# Patient Record
Sex: Female | Born: 1970 | Race: White | Hispanic: No | Marital: Single | Smoking: Current every day smoker
Health system: Southern US, Community
[De-identification: ages and names within clinical notes are randomized; demographics above are authoritative.]

## PROBLEM LIST (undated history)

## (undated) DIAGNOSIS — H409 Unspecified glaucoma: Secondary | ICD-10-CM

## (undated) DIAGNOSIS — F17200 Nicotine dependence, unspecified, uncomplicated: Secondary | ICD-10-CM

## (undated) DIAGNOSIS — F419 Anxiety disorder, unspecified: Secondary | ICD-10-CM

## (undated) DIAGNOSIS — J300 Vasomotor rhinitis: Secondary | ICD-10-CM

## (undated) HISTORY — DX: Vasomotor rhinitis: J30.0

## (undated) HISTORY — DX: Nicotine dependence, unspecified, uncomplicated: F17.200

---

## 1998-03-04 ENCOUNTER — Emergency Department (HOSPITAL_COMMUNITY): Admission: EM | Admit: 1998-03-04 | Discharge: 1998-03-05 | Payer: Self-pay | Admitting: Emergency Medicine

## 2006-03-13 ENCOUNTER — Ambulatory Visit: Payer: Self-pay | Admitting: Family Medicine

## 2006-04-02 ENCOUNTER — Ambulatory Visit: Payer: Self-pay | Admitting: *Deleted

## 2010-01-06 ENCOUNTER — Emergency Department (HOSPITAL_BASED_OUTPATIENT_CLINIC_OR_DEPARTMENT_OTHER): Admission: EM | Admit: 2010-01-06 | Discharge: 2010-01-06 | Payer: Self-pay | Admitting: Emergency Medicine

## 2010-01-06 ENCOUNTER — Ambulatory Visit: Payer: Self-pay | Admitting: Diagnostic Radiology

## 2010-01-10 ENCOUNTER — Ambulatory Visit: Payer: Self-pay | Admitting: Family Medicine

## 2010-12-21 LAB — CBC
MCHC: 34.1 g/dL (ref 30.0–36.0)
RBC: 4.44 MIL/uL (ref 3.87–5.11)
RDW: 12.6 % (ref 11.5–15.5)

## 2010-12-21 LAB — COMPREHENSIVE METABOLIC PANEL
ALT: <6 U/L (ref 0–35)
BUN: 13 mg/dL (ref 6–23)
CO2: 26 mEq/L (ref 19–32)
Calcium: 10.4 mg/dL (ref 8.4–10.5)
Creatinine, Ser: 0.6 mg/dL (ref 0.4–1.2)
Potassium: 4 mEq/L (ref 3.5–5.1)
Sodium: 145 mEq/L (ref 135–145)
Total Bilirubin: 0.4 mg/dL (ref 0.3–1.2)
Total Protein: 7.6 g/dL (ref 6.0–8.3)

## 2010-12-21 LAB — URINE MICROSCOPIC-ADD ON

## 2010-12-21 LAB — DIFFERENTIAL
Basophils Absolute: 0.1 10*3/uL (ref 0.0–0.1)
Basophils Relative: 1 % (ref 0–1)
Eosinophils Relative: 1 % (ref 0–5)
Neutrophils Relative %: 66 % (ref 43–77)

## 2010-12-21 LAB — URINALYSIS, ROUTINE W REFLEX MICROSCOPIC
Nitrite: NEGATIVE
Protein, ur: NEGATIVE mg/dL
pH: 5 (ref 5.0–8.0)

## 2011-02-04 IMAGING — US US ABDOMEN COMPLETE
1 series · 13 of 25 positions shown · non-contrast
Comparison: None

CLINICAL DATA: History of abdominal pain and epigastric pain
radiating into the lower abdomen on both sides.  Pain for 1 week.

ABDOMINAL ULTRASOUND COMPLETE

[Series 1: us abdomen complete · 0.32mm/px · 13 of 57 slices shown]
[im 1/57]
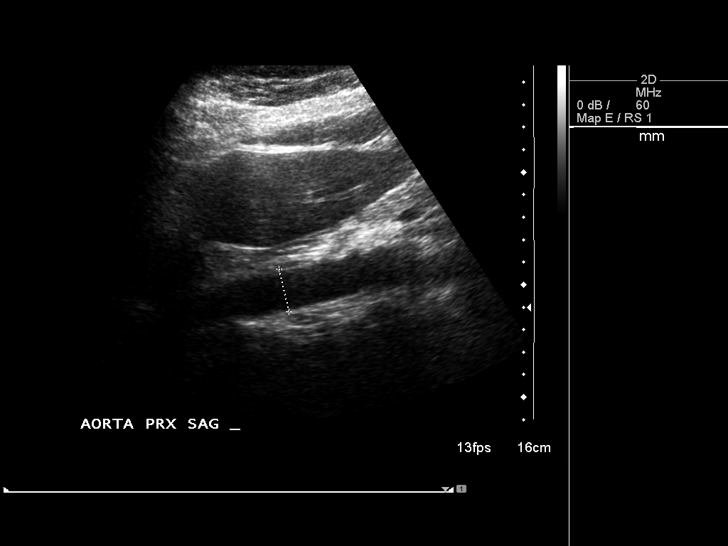
[im 5/57]
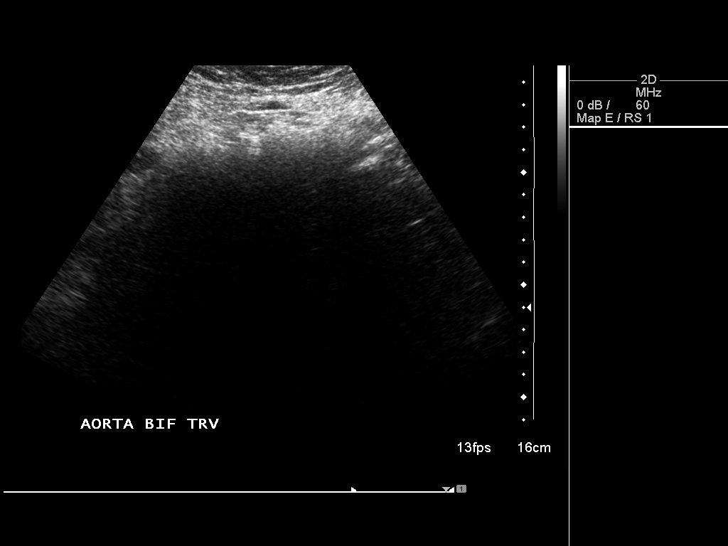
[im 10/57]
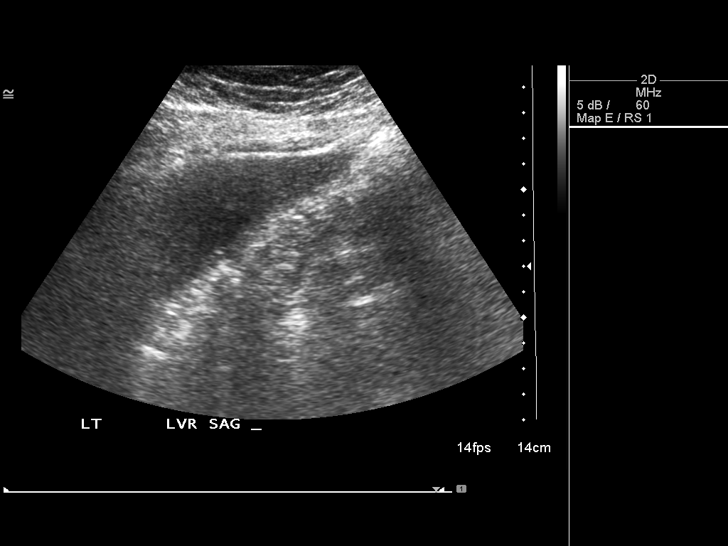
[im 15/57]
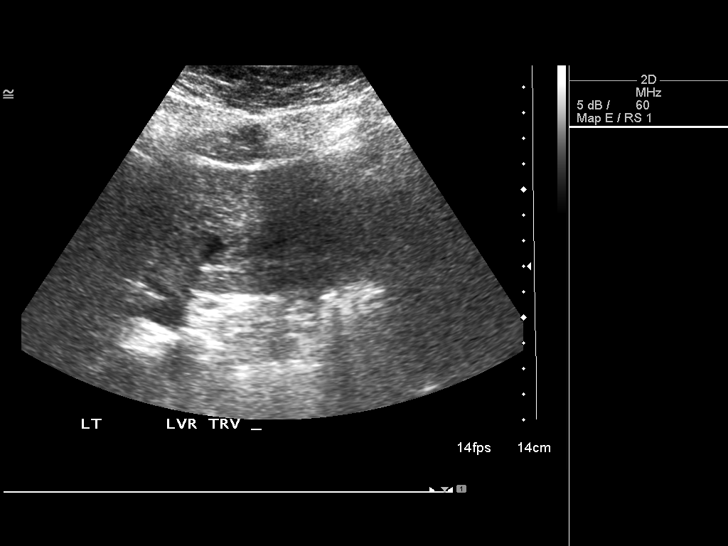
[im 19/57]
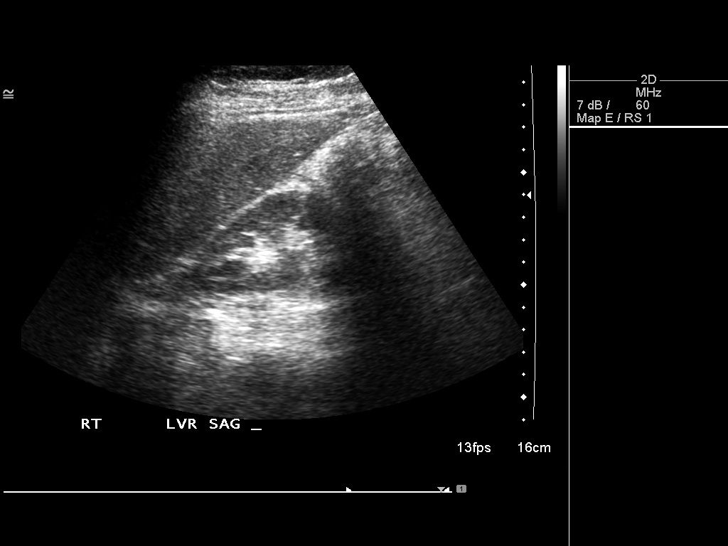
[im 24/57]
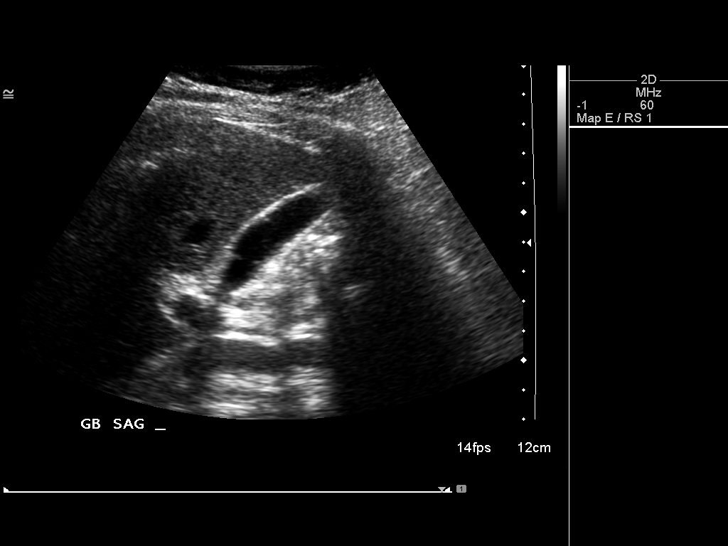
[im 29/57]
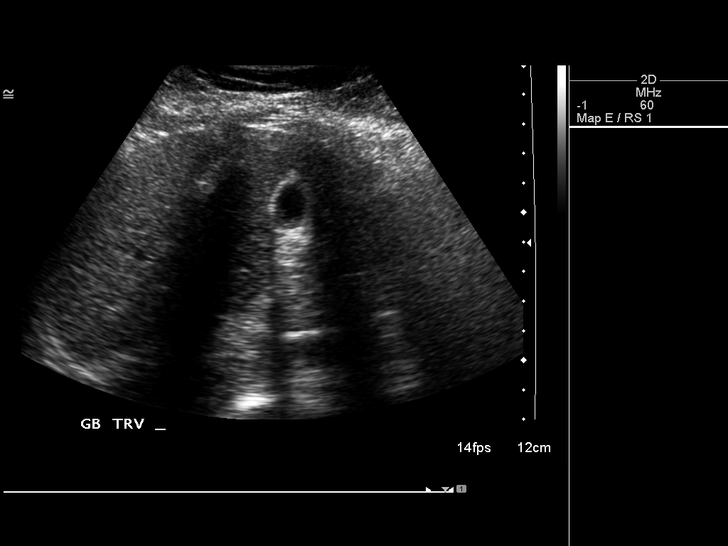
[im 33/57]
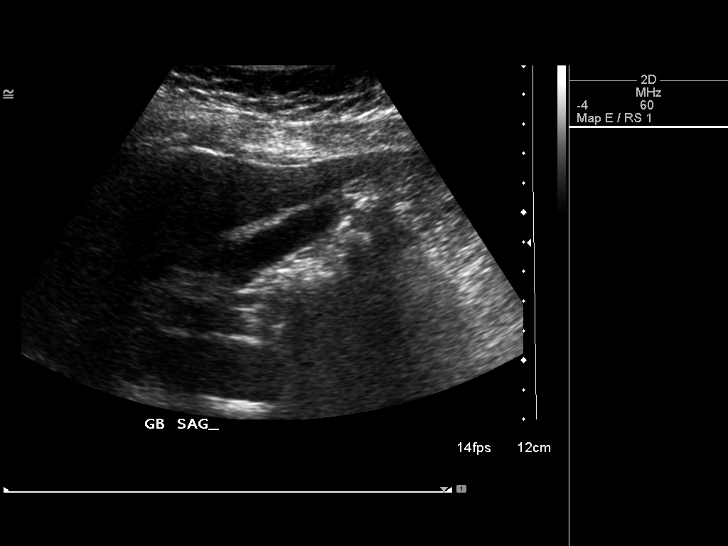
[im 38/57]
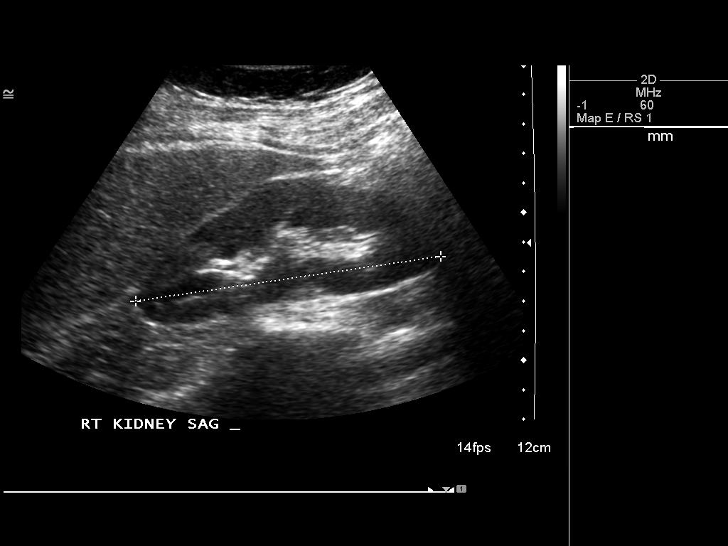
[im 43/57]
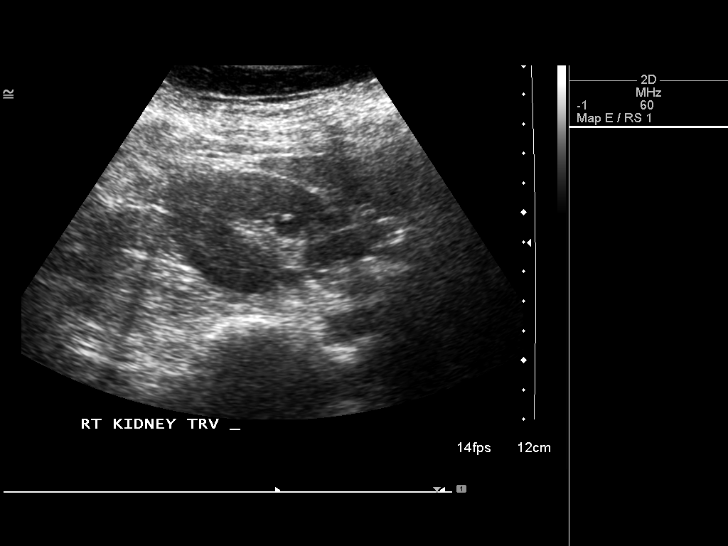
[im 47/57]
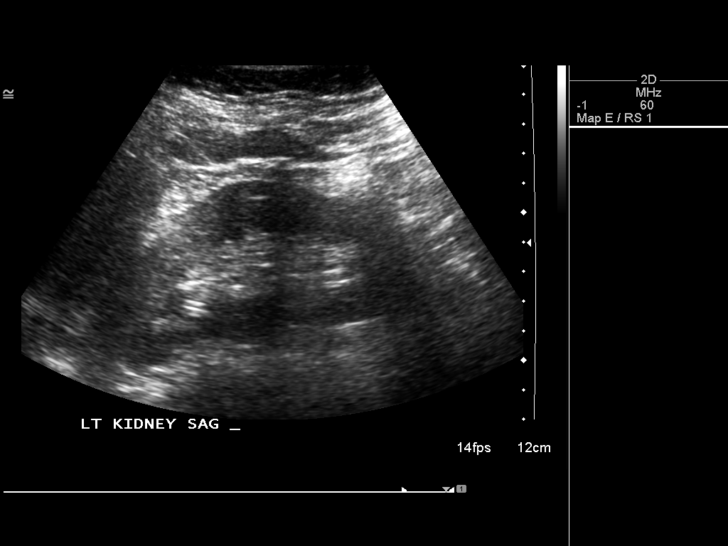
[im 52/57]
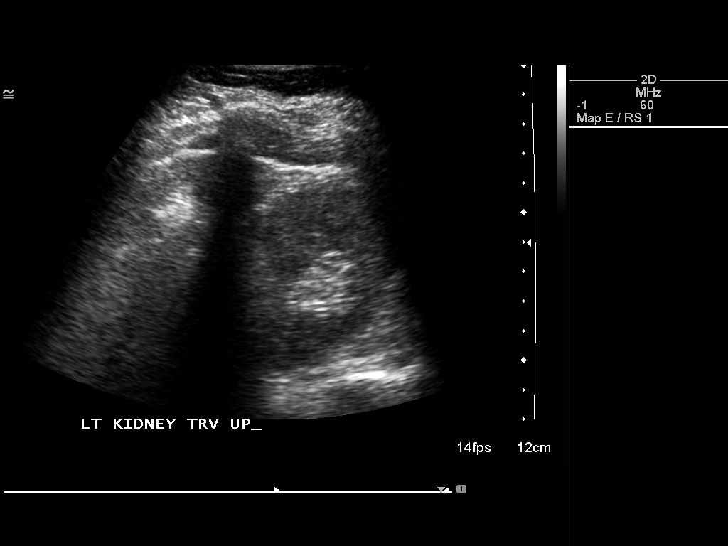
[im 57/57]
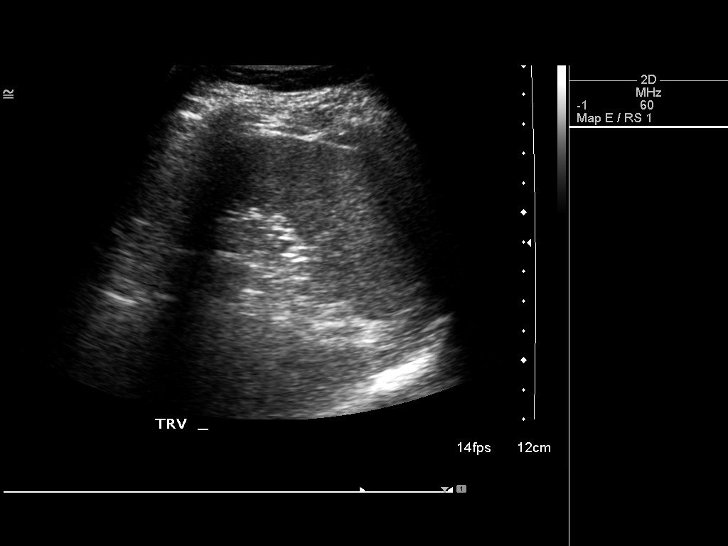

[13 of 25 positions shown; findings below may reference images not displayed]

FINDINGS: Gallbladder: No shadowing gallstones or echogenic sludge. No
gallbladder wall thickening or pericholecystic fluid. The
gallbladder wall thickness measured 2.4 mm. No sonographic Murphy's
sign according to the ultrasound technologist. The patient was not
n.p.o. and the gallbladder was not well distended.  There appear to
be small comet-tail artifacts or small polyps associated with the
gallbladder wall.  However the gallbladder was not well distended
and I cannot exclude that these densities could be associated with
a partially contracted gallbladder.

CBD: Normal in caliber measuring 2 mm. No choledocholithiasis is
evident.

Liver:  Normal size and echotexture without focal parenchymal
abnormality.

IVC:  Patent throughout its visualized course in the abdomen.

Pancreas:  Although the pancreas is difficult to visualize in its
entirety, no focal pancreatic abnormality is identified.

Spleen:  Normal size and echotexture without focal abnormality.
Length is 9.3 cm.

Right kidney:  No hydronephrosis.  Well-preserved cortex.  Normal
parenchymal echotexture without focal abnormalities.  Right renal
length is 10.4 cm.

Left kidney:  No hydronephrosis.  Well-preserved cortex.  Normal
parenchymal echotexture without focal abnormalities.  Left renal
length is 10.8 cm.

Aorta:  Maximum diameter is 2 cm.  No aneurysm is evident.

Ascites:  None.
IMPRESSION: No acute abdominal pathology was demonstrated.  The gallbladder is
not well distended since the patient had not been fasting.  No
calculi were seen.  Densities are visualized contiguous with
mucosal surface suggesting small polyps and / or comet-tail
artifacts which may be associated with adenomyomatosis of the
gallbladder.  However the gallbladder is not completely distended.
If further evaluation of the gallbladder is felt clinically
indicated, repeat gallbladder ultrasound after the usual period of
fasting could be performed to see whether the possible polyps or
comet-tail artifacts persist when the gallbladder is well
distended.  Alternatively nuclear medicine hepatobiliary imaging
could be considered.

No other abdominal pathology is evident.

## 2011-09-21 ENCOUNTER — Encounter: Payer: Self-pay | Admitting: Internal Medicine

## 2011-09-21 ENCOUNTER — Ambulatory Visit (INDEPENDENT_AMBULATORY_CARE_PROVIDER_SITE_OTHER): Payer: PRIVATE HEALTH INSURANCE | Admitting: Family Medicine

## 2011-09-21 VITALS — BP 142/80 | Temp 99.1°F | Wt 151.0 lb

## 2011-09-21 DIAGNOSIS — F17200 Nicotine dependence, unspecified, uncomplicated: Secondary | ICD-10-CM | POA: Insufficient documentation

## 2011-09-21 DIAGNOSIS — J45909 Unspecified asthma, uncomplicated: Secondary | ICD-10-CM

## 2011-09-21 DIAGNOSIS — R509 Fever, unspecified: Secondary | ICD-10-CM

## 2011-09-21 DIAGNOSIS — J45901 Unspecified asthma with (acute) exacerbation: Secondary | ICD-10-CM

## 2011-09-21 LAB — POCT INFLUENZA A/B: Influenza A, POC: NEGATIVE

## 2011-09-21 MED ORDER — ALBUTEROL SULFATE HFA 108 (90 BASE) MCG/ACT IN AERS: 2.0000 | INHALATION_SPRAY | Freq: Four times a day (QID) | RESPIRATORY_TRACT | Status: DC | PRN

## 2011-09-21 MED ORDER — AMOXICILLIN 875 MG PO TABS: 875.0000 mg | ORAL_TABLET | Freq: Two times a day (BID) | ORAL | Status: AC

## 2011-09-21 NOTE — Progress Notes (Signed)
  Subjective:    Patient ID: Madeline Cervantes, female    DOB: 08-06-1971, 40 y.o.   MRN: [id number]  HPI Madeline Cervantes 2 weeks ago she had difficulty with ringing in ears , chest and head congestion. Started having difficulty with cough about a week ago it is nonproductive. He has had intermittent fever and chills. She has been trying over-the-counter medications without intermittent success. She does smoke but has cut back due to the cough and congestion   Review of Systems     Objective:   Physical Exam alert and in no distress. Tympanic membranes and canals are normal. Throat is clear. Tonsils are normal. Neck is supple without adenopathy or thyromegaly. Cardiac exam shows a regular sinus rhythm without murmurs or gallops. Lungs show scattered rhonchi and wheezing       Assessment & Plan:   1. Fever  POCT Influenza A/B  2. Acute asthmatic bronchitis    3. Current smoker     I will place her on Amoxil. We'll also give Proventil to help with her breathing. Encouraged her to quit smoking and call me if she needs help. She is to call if not entirely better

## 2011-09-21 NOTE — Patient Instructions (Signed)
Take all the antibiotic and call me at the end of the course if you're not 100% better. Use the inhaler as needed for congestion and cough. When you're ready to quit smoking and wants help call me

## 2012-09-03 ENCOUNTER — Encounter: Payer: Self-pay | Admitting: Medical

## 2012-09-03 ENCOUNTER — Ambulatory Visit (INDEPENDENT_AMBULATORY_CARE_PROVIDER_SITE_OTHER): Payer: PRIVATE HEALTH INSURANCE | Admitting: Medical

## 2012-09-03 VITALS — BP 110/82 | Temp 98.2°F | Wt 142.0 lb

## 2012-09-03 DIAGNOSIS — B86 Scabies: Secondary | ICD-10-CM

## 2012-09-03 MED ORDER — HYDROXYZINE HCL 25 MG PO TABS: 25.0000 mg | ORAL_TABLET | Freq: Four times a day (QID) | ORAL | Status: DC | PRN

## 2012-09-03 MED ORDER — PERMETHRIN 5 % EX CREA: TOPICAL_CREAM | CUTANEOUS | Status: DC

## 2012-09-03 NOTE — Progress Notes (Signed)
Subjective:  Here for possible scabies.  Here with both sons today for same c/o.  Son's girlfriend and household contacts have had itching and skin sores over a month ago.   Son was living there initially, but now he and girlfriend and their baby have moved in with mother and every body in mother's house now has itching and sores popping up on their bodies x 43mo. The baby was seen by their pediatrician, initially thought to have eczema, but after things worsened, but just recently treated for scabies and is better. However, everybody in the house now seems to have itching and sores. Otherwise in normal state of health.   Objective:  Gen: wd, wn, nad  Skin: several scattered lesions including small round and some pink raised lesions, some linear burrow type lesions on hands and feet, in between fingers and toes, numerous excoriations on most of back and arms, small scattered pink/red round lesions on back, all suggestive of scabies   Assessment:  Encounter Diagnosis   Name  Primary?   .  Scabies  Yes    Plan:  Discussed diagnosis, means of transmission, discussed thorough home cleaning including washing bed sheets in hot soapy water in washing machine and high heat dryer cycle, fabric things like toys that can't be washed, place in sealed bag for 4 days to suffocate the mites. Can use hydroxyzine for itching. Script for permethrin. May repeat daily for a week, then 2x/wk until resolved. Recheck if not resolved in 2wk.

## 2012-09-03 NOTE — Patient Instructions (Signed)
Scabies Scabies are small bugs (mites) that burrow under the skin and cause red bumps and severe itching. These bugs can only be seen with a microscope. Scabies are highly contagious. They can spread easily from person to person by direct contact. They are also spread through sharing clothing or linens that have the scabies mites living in them. It is not unusual for an entire family to become infected through shared towels, clothing, or bedding.  HOME CARE INSTRUCTIONS   Your caregiver may prescribe a cream or lotion to kill the mites. If cream is prescribed, massage the cream into the entire body from the neck to the bottom of both feet. Also massage the cream into the scalp and face if your child is less than 1 year old. Avoid the eyes and mouth. Do not wash your hands after application.  Leave the cream on for 8 to 12 hours. Your child should bathe or shower after the 8 to 12 hour application period. Sometimes it is helpful to apply the cream to your child right before bedtime.  One treatment is usually effective and will eliminate approximately 95% of infestations. For severe cases, your caregiver may decide to repeat the treatment in 1 week. Everyone in your household should be treated with one application of the cream.  New rashes or burrows should not appear within 24 to 48 hours after successful treatment. However, the itching and rash may last for 2 to 4 weeks after successful treatment. Your caregiver may prescribe a medicine to help with the itching or to help the rash go away more quickly.  Scabies can live on clothing or linens for up to 3 days. All of your child's recently used clothing, towels, stuffed toys, and bed linens should be washed in hot water and then dried in a dryer for at least 20 minutes on high heat. Items that cannot be washed should be enclosed in a plastic bag for at least 3 days.  To help relieve itching, bathe your child in a cool bath or apply cool washcloths to the  affected areas.  Your child may return to school after treatment with the prescribed cream. SEEK MEDICAL CARE IF:   The itching persists longer than 4 weeks after treatment.  The rash spreads or becomes infected. Signs of infection include red blisters or yellow-tan crust. Document Released: 09/18/2005 Document Revised: 12/11/2011 Document Reviewed: 01/27/2009 [employer] Patient Information 2013 [employer].  

## 2012-11-29 ENCOUNTER — Other Ambulatory Visit: Payer: Self-pay | Admitting: Medical

## 2012-11-29 NOTE — Telephone Encounter (Signed)
PATIENT NEEDS A FOLLOW UP VISIT.

## 2012-12-01 ENCOUNTER — Other Ambulatory Visit: Payer: Self-pay | Admitting: Medical

## 2016-04-05 ENCOUNTER — Encounter (HOSPITAL_COMMUNITY): Payer: Self-pay

## 2016-04-05 ENCOUNTER — Emergency Department (HOSPITAL_COMMUNITY)
Admission: EM | Admit: 2016-04-05 | Discharge: 2016-04-05 | Disposition: A | Discharge status: Home / Self Care | Payer: Managed Care, Other (non HMO) | Attending: Emergency Medicine | Admitting: Emergency Medicine

## 2016-04-05 DIAGNOSIS — F17210 Nicotine dependence, cigarettes, uncomplicated: Secondary | ICD-10-CM | POA: Diagnosis not present

## 2016-04-05 DIAGNOSIS — G8929 Other chronic pain: Secondary | ICD-10-CM | POA: Insufficient documentation

## 2016-04-05 DIAGNOSIS — M545 Low back pain, unspecified: Secondary | ICD-10-CM

## 2016-04-05 HISTORY — DX: Anxiety disorder, unspecified: F41.9

## 2016-04-05 HISTORY — DX: Unspecified glaucoma: H40.9

## 2016-04-05 MED ORDER — CYCLOBENZAPRINE HCL 10 MG PO TABS
10.0000 mg | ORAL_TABLET | Freq: Once | ORAL | Status: AC
Start: 2016-04-05 — End: 2016-04-05
  Administered 2016-04-05: 10 mg via ORAL
  Filled 2016-04-05: qty 1

## 2016-04-05 MED ORDER — HYDROCODONE-ACETAMINOPHEN 5-325 MG PO TABS: 1.0000 | ORAL_TABLET | Freq: Four times a day (QID) | ORAL | Status: DC | PRN

## 2016-04-05 MED ORDER — CYCLOBENZAPRINE HCL 10 MG PO TABS: 10.0000 mg | ORAL_TABLET | Freq: Two times a day (BID) | ORAL | Status: DC | PRN

## 2016-04-05 MED ORDER — HYDROCODONE-ACETAMINOPHEN 5-325 MG PO TABS
1.0000 | ORAL_TABLET | Freq: Once | ORAL | Status: AC
  Administered 2016-04-05: 1 via ORAL
  Filled 2016-04-05: qty 1

## 2016-04-05 NOTE — ED Notes (Signed)
Patient complains of chronic lower back pain for months and has known disc disease. Has had increased pain the past several days, denies any new trauma

## 2016-04-05 NOTE — ED Provider Notes (Signed)
CSN: [id number]     Arrival date & time 04/05/16  1530 History  By signing my name below, I, Madeline Cervantes, attest that this documentation has been prepared under the direction and in the presence of [facility] Avielle Imbert, NP-C. Electronically Signed: Phillis HaggisGabriella Cervantes, ED Scribe. 04/05/2016. 4:24 PM.   Chief Complaint  Patient presents with  . Back Pain   Patient is a 45 y.o. female presenting with back pain. The history is provided by the patient. No language interpreter was used.  Back Pain Location:  Lumbar spine Quality:  Aching Radiates to:  L foot, R foot, L thigh and R thigh Pain severity:  Mild Onset quality:  Gradual Timing:  Constant Progression:  Worsening Chronicity:  Chronic Context: not falling and not recent injury   Ineffective treatments:  None tried Associated symptoms: no bladder incontinence, no bowel incontinence, no chest pain, no numbness and no weakness   HPI Comments: Madeline CaffeyKimberly C Cervantes is a 45 y.o. female who presents to the Emergency Department complaining of worsening of her chronic lower back pain that radiate down both legs onset several days ago. She reports associated "knots" to the lower back. She reports worsening pain with ambulation and bending at the waist. Pt has had a previous MRI work-up where it revealed a disc shift despite no trauma to the area. She has been having cortisone shots to the area but has not had any recently. Pt is prescribed Tramadol by her doctor but has not had any in a few weeks. She has been taking OTC pain medication to no relief. Pt is due to follow up with the chiropractor next week. Pt denies new injury, fall, chest pain, SOB, gait problem, rash, wound, numbness, or weakness.   Past Medical History  Diagnosis Date  . Allergic rhinitis   . Vasomotor rhinitis   . Smoker   . Glaucoma   . Anxiety    History reviewed. No pertinent past surgical history. No family history on file. Social History  Substance Use Topics  . Smoking status:  Current Every Day Smoker    Types: Cigarettes  . Smokeless tobacco: None  . Alcohol Use: None   OB History    No data available     Review of Systems  Respiratory: Negative for shortness of breath.   Cardiovascular: Negative for chest pain.  Gastrointestinal: Negative for bowel incontinence.  Genitourinary: Negative for bladder incontinence.  Musculoskeletal: Positive for back pain. Negative for gait problem.  Skin: Negative for rash and wound.  Neurological: Negative for weakness and numbness.   Allergies  Review of patient's allergies indicates no known allergies.  Home Medications   Prior to Admission medications   Medication Sig Start Date End Date Taking? Authorizing Provider  albuterol (PROVENTIL HFA;VENTOLIN HFA) 108 (90 BASE) MCG/ACT inhaler Inhale 2 puffs into the lungs every 6 (six) hours as needed for wheezing. 09/21/11 09/20/12  Ronnald NianJohn C Lalonde, MD  ALPRAZolam Prudy Feeler(XANAX) 0.5 MG tablet Take 0.5 mg by mouth at bedtime as needed.      Historical Provider, MD  cyclobenzaprine (FLEXERIL) 10 MG tablet Take 1 tablet (10 mg total) by mouth 2 (two) times daily as needed for muscle spasms. 04/05/16   Garrick Midgley Orlene OchM Betsaida [place], NP  HYDROcodone-acetaminophen (NORCO) 5-325 MG tablet Take 1 tablet by mouth every 6 (six) hours as needed. 04/05/16   Jasaiah Karwowski Orlene OchM Chad Donoghue, NP  hydrOXYzine (ATARAX/VISTARIL) 25 MG tablet TAKE 1 TABLET (25 MG TOTAL) BY MOUTH EVERY 6 (SIX) HOURS AS NEEDED FOR ITCHING. 11/29/12  Kermit Baloavid S Tysinger, PA-C  hydrOXYzine (ATARAX/VISTARIL) 25 MG tablet TAKE 1 TABLET (25 MG TOTAL) BY MOUTH EVERY 6 (SIX) HOURS AS NEEDED FOR ITCHING. 12/01/12   Kermit Baloavid S Tysinger, PA-C  permethrin (ELIMITE) 5 % cream Apply at bedtime, massage all over body, wash off in the morning.  May repeat daily for up to a week, then 2x /wk until this resolves. 09/03/12   Kermit Baloavid S Tysinger, PA-C   BP 165/98 mmHg  Pulse 89  Temp(Src) 98.1 F (36.7 C) (Oral)  Resp 18  SpO2 96% Physical Exam  Constitutional: She is oriented to  person, place, and time. She appears well-developed and well-nourished.  HENT:  Head: Normocephalic and atraumatic.  Mouth/Throat: Oropharynx is clear and moist.  Eyes: Conjunctivae and EOM are normal. Pupils are equal, round, and reactive to light.  Neck: Normal range of motion. Neck supple.  Cardiovascular: Normal rate and regular rhythm.   Pulses:      Radial pulses are 2+ on the right side, and 2+ on the left side.  Pulmonary/Chest: Effort normal.  Abdominal: Soft. Bowel sounds are normal. There is no tenderness. There is no CVA tenderness.  Musculoskeletal: Normal range of motion.       Lumbar back: She exhibits spasm.  Tenderness with palpation over left and right sciatic nerve  Neurological: She is alert and oriented to person, place, and time.  Grips are equal; distals pulses are 2+; reflexes are normal; steady gait with no foot drag; able to perform bilateral SLRs but complained of pain  Skin: Skin is warm and dry.  Psychiatric: She has a normal mood and affect. Her behavior is normal.  Nursing note and vitals reviewed.   ED Course  Procedures (including critical care time) DIAGNOSTIC STUDIES: Oxygen Saturation is 97% on RA, normal by my interpretation.    COORDINATION OF CARE: 4:24 PM-Discussed treatment plan which includes pain management and work note with pt at bedside and pt agreed to plan.   5:06 PM- pt improved greatly after medication. Safe for discharge.   MDM  [age] y.o. female with hx of chronic low back pain and known disc disease stable for d/c without focal neuro deficits. Will treat for pain and muscle spasm and she will keep her follow up appointment with her doctor. Discussed with the patient clinical findings and plan of care and all questioned fully answered. She will return if any problems arise.  Final diagnoses:  Acute exacerbation of chronic low back pain    I personally performed the services described in this documentation, which was scribed in my  presence. The recorded information has been reviewed and is accurate.      [address]Ayelen Sciortino [place] Latrisha Coiro, NP 04/06/16 0159  Lyndal Pulleyaniel Knott, MD 04/06/16 [phone]

## 2016-04-05 NOTE — Discharge Instructions (Signed)
Your may continue to take ibuprofen with the medications we give you. Do not drive while taking the narcotic or muscle relaxant because they will make you sleepy. Follow up with your doctor as scheduled.

## 2017-02-28 ENCOUNTER — Encounter (HOSPITAL_BASED_OUTPATIENT_CLINIC_OR_DEPARTMENT_OTHER): Payer: Self-pay | Admitting: *Deleted

## 2017-02-28 ENCOUNTER — Emergency Department (HOSPITAL_BASED_OUTPATIENT_CLINIC_OR_DEPARTMENT_OTHER)
Admission: EM | Admit: 2017-02-28 | Discharge: 2017-02-28 | Disposition: A | Discharge status: Home / Self Care | Payer: Managed Care, Other (non HMO) | Attending: Emergency Medicine | Admitting: Emergency Medicine

## 2017-02-28 DIAGNOSIS — M545 Low back pain, unspecified: Secondary | ICD-10-CM

## 2017-02-28 DIAGNOSIS — G8929 Other chronic pain: Secondary | ICD-10-CM

## 2017-02-28 DIAGNOSIS — F17210 Nicotine dependence, cigarettes, uncomplicated: Secondary | ICD-10-CM | POA: Insufficient documentation

## 2017-02-28 MED ORDER — CYCLOBENZAPRINE HCL 10 MG PO TABS: 10.0000 mg | ORAL_TABLET | Freq: Two times a day (BID) | ORAL | 0 refills | Status: DC | PRN

## 2017-02-28 MED ORDER — CYCLOBENZAPRINE HCL 10 MG PO TABS
10.0000 mg | ORAL_TABLET | Freq: Once | ORAL | Status: AC
  Administered 2017-02-28: 10 mg via ORAL
  Filled 2017-02-28: qty 1

## 2017-02-28 NOTE — ED Notes (Signed)
Patient presents with c/o lower back pain that has been ongoing.  Is being seen by an MD and received a cortisone shot last Tuesday but states this one did not help.  States the pain goes down the back of her left leg into her feet and it feels like her toes are curling up. Remains sitting on the side of the bed.

## 2017-02-28 NOTE — ED Triage Notes (Signed)
Pt c/o back pain after "steriod injection" x 1 week ago

## 2017-02-28 NOTE — ED Notes (Signed)
Patients BP elevated - stated that it is because she is frustrated and is hurting

## 2017-02-28 NOTE — Discharge Instructions (Signed)
Please follow up with ortho regarding today's Emergency Department visit. You can take Flexeril by mouth 2 times daily for muscle spasms. Please use caution when working or driving because this medication can make you more sleepy. Please also follow up with your primary care provider within the next week to have your blood pressure re-checked because it was elevated during today's visit. If you develop new symptoms including numbness, weakness, tingling, or incontinence, please return to the Emergency Department for re-evaluation. Please continue to apply ice to the area, and take Tylenol and ibuprofen as directed on the bottle.

## 2017-02-28 NOTE — ED Notes (Signed)
Discharge instructions and prescription reviewed - voiced understanding.  

## 2017-02-28 NOTE — ED Provider Notes (Signed)
[facility] Provider Note   CSN: [id number] Arrival date & time: 02/28/17  1914  By signing my name below, I, Modena JanskyAlbert Thayil, attest that this documentation has been prepared under the direction and in the presence of non-physician practitioner, Frederik PearMia Nilson Tabora, PA-C. Electronically Signed: Modena JanskyAlbert Thayil, Scribe. 02/28/2017. 9:23 PM.  History   Chief Complaint Chief Complaint  Patient presents with  . Back Pain   The history is provided by the patient. No language interpreter was used.   HPI Comments: Madeline Cervantes is a [age] y.o. female who presents to the Emergency Department complaining of constant moderate lower back pain that started about a week ago. She reports her lumbar disc slipped last year and she has been having intermittent pain since then. Her pain became constant after receiving a cortisone injection on 02/20/17 by Dr. Regino SchultzeWang in ortho. She iced her back PTA with minimal relief. Her pain is relieved by bending over and radiates to her LLE with associated left foot tingling. Denies any hx of cancer, fever, abdominal pain, constipation, nausea, vomiting, bladder/bowel incontinence, gait problem, dysuria, or other complaints at this time.  PCP: Ronnald NianLalonde, John C, MD  Past Medical History:  Diagnosis Date  . Allergic rhinitis   . Anxiety   . Glaucoma   . Smoker   . Vasomotor rhinitis     Patient Active Problem List   Diagnosis Date Noted  . Current smoker 09/21/2011    History reviewed. No pertinent surgical history.  OB History    No data available       Home Medications    Prior to Admission medications   Medication Sig Start Date End Date Taking? Authorizing Provider  ALPRAZolam Prudy Feeler(XANAX) 0.5 MG tablet Take 0.5 mg by mouth at bedtime as needed.      [provider]  cyclobenzaprine (FLEXERIL) 10 MG tablet Take 1 tablet (10 mg total) by mouth 2 (two) times daily as needed for muscle spasms. 02/28/17   Lyman Balingit A, PA-C  permethrin (ELIMITE)  5 % cream Apply at bedtime, massage all over body, wash off in the morning.  May repeat daily for up to a week, then 2x /wk until this resolves. 09/03/12   Tysinger, Kermit Baloavid S, PA-C    Family History No family history on file.  Social History Social History  Substance Use Topics  . Smoking status: Current Every Day Smoker    Packs/day: 1.00    Types: Cigarettes  . Smokeless tobacco: Not on file  . Alcohol use No     Allergies   Patient has no known allergies.   Review of Systems Review of Systems  Constitutional: Negative for activity change and fever.  Respiratory: Negative for shortness of breath.   Cardiovascular: Negative for chest pain.  Gastrointestinal: Negative for abdominal pain, constipation, nausea and vomiting.  Genitourinary: Negative for dysuria.  Musculoskeletal: Positive for back pain (Lower). Negative for gait problem.  Skin: Negative for rash.   Physical Exam Updated Vital Signs BP (!) 174/100   Pulse 90   Temp 98.2 F (36.8 C) (Oral)   Resp 18   Ht 5\' 9"  (1.753 m)   Wt 135 lb (61.2 kg)   SpO2 99%   BMI 19.94 kg/m   Physical Exam  Constitutional: No distress.  HENT:  Head: Normocephalic.  Eyes: Conjunctivae are normal.  Neck: Neck supple.  Cardiovascular: Normal rate and regular rhythm.  Exam reveals no gallop and no friction rub.   No murmur heard. Pulmonary/Chest: Effort  normal. No respiratory distress.  Abdominal: Soft. She exhibits no distension.  Musculoskeletal:  Gait is normal. 5/5 strength of the BLE. +2 DP and PT pulses. Full ROM of the bilateral ankle, knees, and hips. Able to ambulate without difficulty and bear weight on BLE. No sensory deficits.  Lateral to the lumbar spine there is a small dime-sized ciruclar area of ecchymosis. No warmth, swelling, or redness. No midline tenderness of the C, T, or L spine. No step offs. Normal ROM.   Neurological: She is alert.  Skin: Skin is warm. No rash noted.  Psychiatric: Her behavior is  normal.  Nursing note and vitals reviewed.    ED Treatments / Results  DIAGNOSTIC STUDIES: Oxygen Saturation is 99% on RA, normal by my interpretation.    COORDINATION OF CARE: 9:27 PM- Pt advised of plan for treatment and pt agrees.  Labs (all labs ordered are listed, but only abnormal results are displayed) Labs Reviewed - No data to display  EKG  EKG Interpretation None       Radiology No results found.  Procedures Procedures (including critical care time)  Medications Ordered in ED Medications  cyclobenzaprine (FLEXERIL) tablet 10 mg (10 mg Oral Given 02/28/17 2158)     Initial Impression / Assessment and Plan / ED Course  I have reviewed the triage vital signs and the nursing notes.  Pertinent labs & imaging results that were available during my care of the patient were reviewed by me and considered in my medical decision making (see chart for details).     Patient with chronic back pain, mechanical in nature. VSS. NAD. No neurological deficits and normal neuro exam.  Patient can walk but states it is painful.  No urinary or bowel incontinence.  No concern for cauda equina, infection, AAA, infection, or fracture.  No constitutional symptoms including fever, chills, or weight loss,  No h/o cancer, IVDU.  Will discharge to home with follow up to ortho, RICE protocol, flexeril, and anti-inflammatory medicine. Discussed ED return precautions. The patient acknowledges the plan at this time.   Final Clinical Impressions(s) / ED Diagnoses   Final diagnoses:  Acute exacerbation of chronic low back pain    New Prescriptions Discharge Medication List as of 02/28/2017 10:35 PM     I personally performed the services described in this documentation, which was scribed in my presence. The recorded information has been reviewed and is accurate.     Frederik Pear A, PA-C 03/02/17 [id number]    Gwyneth Sprout, MD 03/02/17 [phone]

## 2018-12-20 ENCOUNTER — Ambulatory Visit: Payer: 59 | Admitting: Medical

## 2018-12-20 ENCOUNTER — Other Ambulatory Visit: Payer: Self-pay

## 2018-12-20 ENCOUNTER — Encounter: Payer: Self-pay | Admitting: Medical

## 2018-12-20 VITALS — BP 130/90 | HR 102 | Temp 98.1°F | Resp 16 | Ht 68.0 in | Wt 130.8 lb

## 2018-12-20 DIAGNOSIS — R6883 Chills (without fever): Secondary | ICD-10-CM | POA: Diagnosis not present

## 2018-12-20 DIAGNOSIS — R0602 Shortness of breath: Secondary | ICD-10-CM

## 2018-12-20 DIAGNOSIS — R05 Cough: Secondary | ICD-10-CM | POA: Diagnosis not present

## 2018-12-20 DIAGNOSIS — R059 Cough, unspecified: Secondary | ICD-10-CM

## 2018-12-20 MED ORDER — HYDROCODONE-HOMATROPINE 5-1.5 MG/5ML PO SYRP: 5.0000 mL | ORAL_SOLUTION | Freq: Three times a day (TID) | ORAL | 0 refills | Status: AC | PRN

## 2018-12-20 MED ORDER — ALBUTEROL SULFATE HFA 108 (90 BASE) MCG/ACT IN AERS: 2.0000 | INHALATION_SPRAY | Freq: Four times a day (QID) | RESPIRATORY_TRACT | 0 refills | Status: DC | PRN

## 2018-12-20 NOTE — Progress Notes (Signed)
Subjective:     Patient ID: Madeline Cervantes, female   DOB: 04-27-1971, 48 y.o.   MRN: [id number]  HPI Chief Complaint  Patient presents with  . Sore throat    sore throat, sneezing, cough, headache, body achey X wednesday   Here for feeling "like shit."  She note 2 days of coughing a lot, headache from cough, sneezing, headaches, boyd aches, stiff.  Has had some chills.  No fever.   She notes scratchy throat, coughing up some mucous.  Has some sinus pressure.   No recent travel.  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 Has had sick contacts at work.  Felt warm but no specific fever.  Has used some alka seltzer.  Chest is tight.  Denies SOB.   Is a smoker.   No other aggravating or relieving factors. No other complaint.  Past Medical History:  Diagnosis Date  . Allergic rhinitis   . Anxiety   . Glaucoma   . Smoker   . Vasomotor rhinitis    Current Outpatient Medications on File Prior to Visit  Medication Sig Dispense Refill  . ALPRAZolam (XANAX) 0.5 MG tablet Take 0.5 mg by mouth at bedtime as needed.       No current facility-administered medications on file prior to visit.     Review of Systems As in subjective    Objective:   Physical Exam BP 130/90   Pulse (!) 102   Temp 98.1 F (36.7 C) (Oral)   Resp 16   Ht 5\' 8"  (1.727 m)   Wt 130 lb 12.8 oz (59.3 kg)   SpO2 95%   BMI 19.89 kg/m   Wt Readings from Last 3 Encounters:  12/20/18 130 lb 12.8 oz (59.3 kg)  02/28/17 135 lb (61.2 kg)  09/03/12 142 lb (64.4 kg)   General appearance: Alert, WD/WN, no distress,somewhat ill appearing                             Skin: warm, no rash, no diaphoresis                           Head: mild sinus tenderness                            Eyes: conjunctiva normal, corneas clear, PERRLA                            Ears: pearly TMs, external ear canals normal                          Nose: septum midline, turbinates swollen, with erythema and clear discharge  Mouth/throat: MMM, tongue normal, mild pharyngeal erythema                           Neck: supple, no adenopathy, no thyromegaly, non tender                          Heart: RRR, normal S1, S2, no murmurs                         Lungs: +bronchial breath sounds, no rhonchi, no wheezes, no rales  Extremities: no edema, non tender         Assessment:     Encounter Diagnoses  Name Primary?  . Cough Yes  . Chills        Plan:     Flu negative  Symptoms and exam suggest respiratory tract infection.  Begin medication below, rest, hydrate well, discussed preventative measures, self quarantine the next several days until afebrile much improved.  If worse or not improving over the weekend call or go to emergency department  Madeline Cervantes was seen today for sore throat.  Diagnoses and all orders for this visit:  Cough  Chills  Other orders -     HYDROcodone-homatropine (HYCODAN) 5-1.5 MG/5ML syrup; Take 5 mLs by mouth every 8 (eight) hours as needed for up to 5 days. -     albuterol (PROVENTIL HFA;VENTOLIN HFA) 108 (90 Base) MCG/ACT inhaler; Inhale 2 puffs into the lungs every 6 (six) hours as needed for wheezing or shortness of breath.

## 2018-12-23 ENCOUNTER — Telehealth: Payer: Self-pay | Admitting: Medical

## 2018-12-23 LAB — POCT INFLUENZA A/B
INFLUENZA A, POC: NEGATIVE
INFLUENZA B, POC: NEGATIVE

## 2018-12-23 NOTE — Telephone Encounter (Signed)
Pt called and state that she came in and was written out of work until tomorrow. Pt states that she is feeling a lot better and now does not have any symptoms. Her work is requiring a note from [place] stating that pt is not experiencing any flu like symptoms before they will let her return. Please advise pt at [phone].

## 2018-12-23 NOTE — Addendum Note (Signed)
Addended by: Derinda Late on: 12/23/2018 08:58 AM   Modules accepted: Orders

## 2018-12-24 NOTE — Telephone Encounter (Signed)
Current CDC and health dept guidelines is that anybody with respiratory illness (flu, cold, bronchitis, etc.) should be symptom FREE for 72 hours before returning to work.     I saw her on 12/20/2018.   Given her symptoms and usual time frame for illness, if she is much better, she still shouldn't go back to work probably any earlier than Thursday.   If she is symptoms free yesterday and today, lets give her 1 more day out of work and write her note stating she is cleared to return to work on Thursday, 12/26/2018.

## 2018-12-24 NOTE — Telephone Encounter (Signed)
Patient is feeling much better, and note has been written for patient to return to work on 3/26. Patient will call when she arrive to pick up letter.

## 2018-12-24 NOTE — Telephone Encounter (Signed)
Needs to go to [facility] per office protocol

## 2019-01-12 ENCOUNTER — Other Ambulatory Visit: Payer: Self-pay | Admitting: Medical

## 2019-01-13 ENCOUNTER — Telehealth: Payer: Self-pay | Admitting: Medical

## 2019-01-13 NOTE — Telephone Encounter (Signed)
Is this okay to refill? 

## 2019-01-13 NOTE — Telephone Encounter (Signed)
I received refill request on albuterol.   What symptoms is she having currently?     Not sure if she needs recheck, vs whether she is much improved?

## 2019-01-14 NOTE — Telephone Encounter (Signed)
Patient states that she is good and did not call for refill.   Patient still has over 1/2 of her inhaler left.

## 2019-01-14 NOTE — Telephone Encounter (Signed)
Left message on voicemail for patient to call back. 

## 2019-01-22 ENCOUNTER — Telehealth: Payer: Self-pay | Admitting: Family Medicine

## 2019-01-22 NOTE — Telephone Encounter (Signed)
Pharmacy sent in refill for albuterol inhaler called pt and asked to see if she needed and this and she said she wouldn't mind having a refill just in case she needed a spare pt uses [id number] - [place], [place] - [address]

## 2019-01-23 ENCOUNTER — Other Ambulatory Visit: Payer: Self-pay | Admitting: Medical

## 2019-01-23 MED ORDER — ALBUTEROL SULFATE HFA 108 (90 BASE) MCG/ACT IN AERS: 2.0000 | INHALATION_SPRAY | Freq: Four times a day (QID) | RESPIRATORY_TRACT | 0 refills | Status: DC | PRN

## 2019-01-23 NOTE — Telephone Encounter (Signed)
Med sent.  How is she feeling?  Did she not improve a lot from last visit in march?  Or does she feel this is related to allergies currently?

## 2019-01-23 NOTE — Telephone Encounter (Signed)
Left message on voicemail for patient to call back. 

## 2019-01-27 NOTE — Telephone Encounter (Signed)
Left message on voicemail for patient to call back. 

## 2019-01-29 NOTE — Telephone Encounter (Signed)
Patient never called back.

## 2019-07-23 ENCOUNTER — Encounter: Payer: Self-pay | Admitting: Medical

## 2019-07-23 ENCOUNTER — Other Ambulatory Visit: Payer: Self-pay

## 2019-07-23 ENCOUNTER — Ambulatory Visit: Payer: 59 | Admitting: Medical

## 2019-07-23 VITALS — Ht 68.0 in | Wt 135.0 lb

## 2019-07-23 DIAGNOSIS — R0602 Shortness of breath: Secondary | ICD-10-CM | POA: Diagnosis not present

## 2019-07-23 DIAGNOSIS — R059 Cough, unspecified: Secondary | ICD-10-CM

## 2019-07-23 DIAGNOSIS — J3489 Other specified disorders of nose and nasal sinuses: Secondary | ICD-10-CM | POA: Diagnosis not present

## 2019-07-23 DIAGNOSIS — R05 Cough: Secondary | ICD-10-CM | POA: Diagnosis not present

## 2019-07-23 DIAGNOSIS — F17200 Nicotine dependence, unspecified, uncomplicated: Secondary | ICD-10-CM | POA: Diagnosis not present

## 2019-07-23 MED ORDER — BENZONATATE 200 MG PO CAPS: 200.0000 mg | ORAL_CAPSULE | Freq: Three times a day (TID) | ORAL | 0 refills | Status: DC | PRN

## 2019-07-23 MED ORDER — ALBUTEROL SULFATE HFA 108 (90 BASE) MCG/ACT IN AERS: 2.0000 | INHALATION_SPRAY | Freq: Four times a day (QID) | RESPIRATORY_TRACT | 0 refills | Status: DC | PRN

## 2019-07-23 MED ORDER — AMOXICILLIN 875 MG PO TABS: 875.0000 mg | ORAL_TABLET | Freq: Two times a day (BID) | ORAL | 0 refills | Status: DC

## 2019-07-23 NOTE — Progress Notes (Signed)
Subjective:     Patient ID: Madeline Cervantes, female   DOB: Sep 09, 1971, 48 y.o.   MRN: [id number]  This visit type was conducted due to national recommendations for restrictions regarding the COVID-19 Pandemic (e.g. social distancing) in an effort to limit this patient's exposure and mitigate transmission in our community.  Due to their co-morbid illnesses, this patient is at least at moderate risk for complications without adequate follow up.  This format is felt to be most appropriate for this patient at this time.    Documentation for virtual audio and video telecommunications through Zoom encounter:  The patient was located at home. The provider was located in the office. The patient did consent to this visit and is aware of possible charges through their insurance for this visit.  The other persons participating in this telemedicine service were none. Time spent on call was 15 minutes and in review of previous records >15 minutes total.  This virtual service is not related to other E/M service within previous 7 days.   HPI Chief Complaint  Patient presents with  . Cough    with nasal drainage-pain in side when coughing-scratchy throat   virtual consult today for cough, drippy nose.  She reports about a week hx/o runny drippy nose, hacking cough from the drainage.   Coughing up white yellow mucous.  Coughing so hard side is hurting.  Ears feel stopped up.   No body aches.  No fever.  Has to check temp daily.   Blowing nose 18 times per night.  throat feels dry.  No change in smell or taste.   No wheezing.  Is a smoker, 1ppd.  Gets this every years about this time.   No sick contacts.  Livers alone.   Did sleep with windows open recently and she thinks this triggered her symptoms.   She does get seasonal allergies this time a year that seems to lead to bronchitis and sinus infection.   Usually antibiotic and inhaler resolve her symptom.   She is quite drowsy from the mucinex Max cold and  flu she is taking (which contains decongestants).  No covid exposure.  No other aggravating or relieving factors. No other complaint.   Review of Systems As in subjective  Past Medical History:  Diagnosis Date  . Allergic rhinitis   . Anxiety   . Glaucoma   . Smoker   . Vasomotor rhinitis    Current Outpatient Medications on File Prior to Visit  Medication Sig Dispense Refill  . albuterol (VENTOLIN HFA) 108 (90 Base) MCG/ACT inhaler Inhale 2 puffs into the lungs every 6 (six) hours as needed for wheezing or shortness of breath. 1 Inhaler 0  . ALPRAZolam (XANAX) 0.5 MG tablet Take 0.5 mg by mouth at bedtime as needed.       No current facility-administered medications on file prior to visit.        Objective:   Physical Exam  Ht 5\' 8"  (1.727 m)   Wt 135 lb (61.2 kg)   BMI 20.53 kg/m   Due to coronavirus pandemic stay at home measures, patient visit was virtual and they were not examined in person.        Assessment:     Encounter Diagnoses  Name Primary?  . Cough Yes  . Rhinorrhea   . Smoker   . SOB (shortness of breath)        Plan:     Discussed symptoms, concerns. Advised she go for Covid testing  although this seems less likely diagnosis.  [facility]   discussed self quarantine measures.    Begin medications below. Stop the mucinex Max given her side effects and risks discussed.   F/u if not improving or worse in the next few days.   We wil await covid tests.    Madeline Cervantes was seen today for cough.  Diagnoses and all orders for this visit:  Cough  Rhinorrhea  Smoker  SOB (shortness of breath)  Other orders -     benzonatate (TESSALON) 200 MG capsule; Take 1 capsule (200 mg total) by mouth 3 (three) times daily as needed for cough. -     albuterol (VENTOLIN HFA) 108 (90 Base) MCG/ACT inhaler; Inhale 2 puffs into the lungs every 6 (six) hours as needed for wheezing or shortness of breath. -     amoxicillin (AMOXIL) 875 MG tablet;  Take 1 tablet (875 mg total) by mouth 2 (two) times daily.

## 2019-07-24 ENCOUNTER — Encounter: Payer: Self-pay | Admitting: Medical

## 2019-07-24 ENCOUNTER — Other Ambulatory Visit: Payer: Self-pay

## 2019-07-24 DIAGNOSIS — Z20822 Contact with and (suspected) exposure to covid-19: Secondary | ICD-10-CM

## 2019-07-24 NOTE — Progress Notes (Signed)
Done

## 2019-07-26 LAB — NOVEL CORONAVIRUS, NAA: SARS-CoV-2, NAA: NOT DETECTED

## 2019-09-29 ENCOUNTER — Telehealth: Payer: Self-pay | Admitting: Internal Medicine

## 2019-09-29 NOTE — Telephone Encounter (Signed)
Per pharmacy,  They spoke to patient about asthma care and noticed patient on multiple rescue inhalers without filling a controller med within the last 180 days. Pharmacy wanting to reach out to determine if appropriate to start on daily asthma controller therapy. Send in new rx

## 2019-09-29 NOTE — Telephone Encounter (Signed)
I would schedule her for a physical.  I have not seen her for a physical.  I do not believe we have done a baseline PFT when she has not been sick.  Last virtual visit we did was for possible Covid/respiratory infection  So yes if she has refill multiple inhaler then we should do a follow-up for physical as she probably does need to be on a preventive inhaler.  But we need to rule out other causes of shortness of breath

## 2019-09-29 NOTE — Telephone Encounter (Signed)
Left message for pt to call back to schedule cpe to determine if preventative inhaler is needed

## 2020-01-06 ENCOUNTER — Telehealth: Payer: Self-pay | Admitting: Family Medicine

## 2020-01-06 NOTE — Telephone Encounter (Signed)
Called pt left message and sent my chart message.  She needs cpe with fasting labs.  She also needs a controller medication for asthma.

## 2020-01-13 ENCOUNTER — Ambulatory Visit: Payer: 59 | Admitting: Medical

## 2020-01-13 ENCOUNTER — Other Ambulatory Visit: Payer: Self-pay

## 2020-01-13 ENCOUNTER — Encounter: Payer: Self-pay | Admitting: Medical

## 2020-01-13 DIAGNOSIS — J3489 Other specified disorders of nose and nasal sinuses: Secondary | ICD-10-CM | POA: Diagnosis not present

## 2020-01-13 DIAGNOSIS — J301 Allergic rhinitis due to pollen: Secondary | ICD-10-CM

## 2020-01-13 MED ORDER — HYDROCODONE-HOMATROPINE 5-1.5 MG/5ML PO SYRP: 5.0000 mL | ORAL_SOLUTION | Freq: Three times a day (TID) | ORAL | 0 refills | Status: AC | PRN

## 2020-01-13 MED ORDER — CETIRIZINE HCL 10 MG PO TABS: 10.0000 mg | ORAL_TABLET | Freq: Every day | ORAL | 1 refills | Status: DC

## 2020-01-13 MED ORDER — AMOXICILLIN 875 MG PO TABS: 875.0000 mg | ORAL_TABLET | Freq: Two times a day (BID) | ORAL | 0 refills | Status: DC

## 2020-01-13 MED ORDER — FLUTICASONE PROPIONATE 50 MCG/ACT NA SUSP: 2.0000 | Freq: Every day | NASAL | 3 refills | Status: DC

## 2020-01-13 NOTE — Progress Notes (Signed)
done

## 2020-01-13 NOTE — Progress Notes (Signed)
This visit type was conducted due to national recommendations for restrictions regarding the COVID-19 Pandemic (e.g. social distancing) in an effort to limit this patient's exposure and mitigate transmission in our community.  Due to their co-morbid illnesses, this patient is at least at moderate risk for complications without adequate follow up.  This format is felt to be most appropriate for this patient at this time.    Documentation for virtual audio and video telecommunications through Zoom encounter:  The patient was located at home. The provider was located in the office. The patient did consent to this visit and is aware of possible charges through their insurance for this visit.  The other persons participating in this telemedicine service were none. Time spent on call was  15 minutes and in review of previous records > 15 minutes total.  This virtual service is not related to other E/M service within previous 7 days.  Subjective: Chief Complaint  Patient presents with  . allergies    allergies- cough, runny nose, sore throat, congestion, drainage- started yesterday morning.    Here for allergy problems.  She notes several week history of nasal congestion, runny nose, sore throat, no stopped up, coughing, mucus drainage.  The allergies are giving her a hard time.  She has to walk her dogs and [place], seen pollen everywhere.  Her son mowed the yard yesterday.  She cannot do yard work due to severe allergy reactions in the spring.  She is using leftover cough medicine from a year ago that I prescribed.  She also is using some over-the-counter Tylenol Sinus.  She deals with this every spring during allergy season.  She continues to smoke.  She has a history of glaucoma, so wants to make sure her medicines will be okay   Past Medical History:  Diagnosis Date  . Allergic rhinitis   . Anxiety   . Glaucoma   . Smoker   . Vasomotor rhinitis    Current Outpatient  Medications on File Prior to Visit  Medication Sig Dispense Refill  . albuterol (VENTOLIN HFA) 108 (90 Base) MCG/ACT inhaler Inhale 2 puffs into the lungs every 6 (six) hours as needed for wheezing or shortness of breath. 1 Inhaler 0  . albuterol (VENTOLIN HFA) 108 (90 Base) MCG/ACT inhaler Inhale 2 puffs into the lungs every 6 (six) hours as needed for wheezing or shortness of breath. 18 g 0  . ALPRAZolam (XANAX) 0.5 MG tablet Take 0.5 mg by mouth at bedtime as needed.       No current facility-administered medications on file prior to visit.   ROS as in subjective    Objective: Not examined in person as this is a virtual consult   Assessment: Encounter Diagnoses  Name Primary?  . Seasonal allergic rhinitis due to pollen Yes  . Rhinorrhea      Plan: Begin Zyrtec daily at bedtime, Flonase nasal spray, can use cough syrup Hycodan as needed.  Discussed risk and benefits of medication, proper use of medication.  She was adamant about having amoxicillin just in case this turns into a sinus infection.  We discussed that currently symptoms suggest allergic rhinitis and not a sinus infection.  I gave her a watch and wait prescription just in case.  We discussed hygiene, daily shower to get all pollen from the body and hair.  Consider nasal saline rinse consider, consider eye rinse with saline as well to get off pollen.  Follow-up if not improving significantly  over the next week   Abiha was seen today for allergies.  Diagnoses and all orders for this visit:  Seasonal allergic rhinitis due to pollen  Rhinorrhea  Other orders -     cetirizine (ZYRTEC) 10 MG tablet; Take 1 tablet (10 mg total) by mouth at bedtime. -     fluticasone (FLONASE) 50 MCG/ACT nasal spray; Place 2 sprays into both nostrils daily. -     HYDROcodone-homatropine (HYCODAN) 5-1.5 MG/5ML syrup; Take 5 mLs by mouth every 8 (eight) hours as needed for up to 5 days. -     amoxicillin (AMOXIL) 875 MG tablet; Take 1  tablet (875 mg total) by mouth 2 (two) times daily.

## 2020-01-14 ENCOUNTER — Ambulatory Visit: Payer: Managed Care, Other (non HMO) | Admitting: Medical

## 2020-01-15 NOTE — Progress Notes (Signed)
done

## 2020-02-25 ENCOUNTER — Ambulatory Visit: Payer: 59 | Admitting: Family Medicine

## 2020-02-25 ENCOUNTER — Emergency Department (HOSPITAL_COMMUNITY)
Admission: EM | Admit: 2020-02-25 | Discharge: 2020-02-26 | Discharge status: Against Medical Advice | Payer: 59 | Attending: Emergency Medicine | Admitting: Emergency Medicine

## 2020-02-25 ENCOUNTER — Emergency Department (HOSPITAL_COMMUNITY): Payer: 59

## 2020-02-25 ENCOUNTER — Other Ambulatory Visit: Payer: Self-pay

## 2020-02-25 ENCOUNTER — Encounter (HOSPITAL_COMMUNITY): Payer: Self-pay | Admitting: *Deleted

## 2020-02-25 ENCOUNTER — Encounter: Payer: Self-pay | Admitting: Family Medicine

## 2020-02-25 VITALS — BP 126/84 | HR 92 | Temp 98.6°F | Wt 132.8 lb

## 2020-02-25 DIAGNOSIS — R079 Chest pain, unspecified: Secondary | ICD-10-CM

## 2020-02-25 DIAGNOSIS — F17200 Nicotine dependence, unspecified, uncomplicated: Secondary | ICD-10-CM

## 2020-02-25 DIAGNOSIS — R0789 Other chest pain: Secondary | ICD-10-CM | POA: Diagnosis present

## 2020-02-25 DIAGNOSIS — Z8249 Family history of ischemic heart disease and other diseases of the circulatory system: Secondary | ICD-10-CM

## 2020-02-25 DIAGNOSIS — F17210 Nicotine dependence, cigarettes, uncomplicated: Secondary | ICD-10-CM | POA: Diagnosis not present

## 2020-02-25 LAB — BASIC METABOLIC PANEL
Anion gap: 12 (ref 5–15)
BUN: 8 mg/dL (ref 6–20)
CO2: 24 mmol/L (ref 22–32)
Calcium: 9.4 mg/dL (ref 8.9–10.3)
Chloride: 101 mmol/L (ref 98–111)
Creatinine, Ser: 0.5 mg/dL (ref 0.44–1.00)
GFR calc Af Amer: >60 mL/min (ref >60)
GFR calc non Af Amer: >60 mL/min (ref >60)
Glucose, Bld: 94 mg/dL (ref 70–99)
Potassium: 4 mmol/L (ref 3.5–5.1)
Sodium: 137 mmol/L (ref 135–145)

## 2020-02-25 LAB — CBC
HCT: 43.4 % (ref 36.0–46.0)
Hemoglobin: 14.6 g/dL (ref 12.0–15.0)
MCH: 33 pg (ref 26.0–34.0)
MCHC: 33.6 g/dL (ref 30.0–36.0)
MCV: 98 fL (ref 80.0–100.0)
Platelets: 264 10*3/uL (ref 150–400)
RBC: 4.43 MIL/uL (ref 3.87–5.11)
RDW: 13.1 % (ref 11.5–15.5)
WBC: 8.6 10*3/uL (ref 4.0–10.5)
nRBC: 0 % (ref 0.0–0.2)

## 2020-02-25 LAB — TROPONIN I (HIGH SENSITIVITY): Troponin I (High Sensitivity): 3 ng/L (ref <18)

## 2020-02-25 MED ORDER — SODIUM CHLORIDE 0.9% FLUSH: 3.0000 mL | Freq: Once | INTRAVENOUS | Status: DC

## 2020-02-25 NOTE — ED Notes (Signed)
Pt says she was outside when her name was called

## 2020-02-25 NOTE — ED Notes (Signed)
No reply for vitals x6.

## 2020-02-25 NOTE — Progress Notes (Signed)
   Subjective:    Patient ID: Madeline Cervantes, female    DOB: [birth year], 49 y.o.   MRN: [id number]  HPI She is here for evaluation of chest pain that started in her mid abdominal area radiating into her chest, back and jaw.  She describes it as dull,tightness and did cause some lightheaded feeling.  No diaphoresis, shortness of breath, weakness.  There is a positive family history of heart disease with her mother having CABG.  She smokes.  Review of Systems     Objective:   Physical Exam Alert and in no distress.  Cardiac exam shows a sinus rhythm with an intermittent S4.  Lungs are clear to auscultation.  EKG read by me shows a normal sinus rhythm with other parameters being normal.       Assessment & Plan:  Chest pain, unspecified type  Family history of MI (myocardial infarction)  Smoker Since she is still having chest pain radiating into her neck with a family history of MI and smoker, I will have her transported to the hospital for further evaluation and treatment She was given a baby aspirin and nitroglycerin. She states that the nitroglycerin did help.

## 2020-02-25 NOTE — ED Triage Notes (Signed)
Pt arrived via [facility] with chest pain that started at 0045 with radiation to chest, jaw, back, lightheaded.EKG negative  And pain went from 7 to a 2 after nitro and ASA. Pt complains of low achy back and chest discomfort 2/10.

## 2020-02-26 LAB — TROPONIN I (HIGH SENSITIVITY): Troponin I (High Sensitivity): 4 ng/L (ref <18)

## 2020-02-26 LAB — D-DIMER, QUANTITATIVE: D-Dimer, Quant: 0.49 ug/mL-FEU (ref 0.00–0.50)

## 2020-02-26 NOTE — Discharge Instructions (Addendum)
You were seen today for chest pain.  Your work-up today is reassuring including your heart testing and screening test for blood clots.  Given your risk factors, you do need to follow-up with cardiology for definitive testing.

## 2020-02-26 NOTE — ED Provider Notes (Signed)
[facility] EMERGENCY DEPARTMENT Provider Note   CSN: [id number] Arrival date & time: 02/25/20  1420     History Chief Complaint  Patient presents with  . Chest Pain    Madeline Cervantes is a 49 y.o. female.  HPI  HPI: A 49 year old patient with a history of hypertension and hypercholesterolemia presents for evaluation of chest pain. Initial onset of pain was more than 6 hours ago. The patient's chest pain is described as heaviness/pressure/tightness and is not worse with exertion. The patient's chest pain is not middle- or left-sided, is not well-localized, is not sharp and does radiate to the arms/jaw/neck. The patient does not complain of nausea and denies diaphoresis. The patient has no history of stroke, has no history of peripheral artery disease, has not smoked in the past 90 days, denies any history of treated diabetes, has no relevant family history of coronary artery disease (first degree relative at less than age 75) and does not have an elevated BMI (>=30).   This is a 49 year old female with a history of anxiety, smoking, hyperlipidemia who presents with chest pain.  Patient reports she had onset at approximately 1 AM on Tuesday morning.  She was leaving work.  Pain was pressure-like and in the center of her chest.  It radiated into her neck and her jaw.  Pain was constant and not worsened or relieved by exercise.  Patient states that she took a Xanax and was able to sleep.  She saw her primary physician yesterday who referred her to the ED.  No known history of coronary artery disease.  Currently she is rating "a little bit of pressure" on her chest.  She cannot quantify it for me.  No diaphoresis or shortness of breath.  Denies lower extremity swelling or history of blood clots.  Does report family history of blood clots.  Past Medical History:  Diagnosis Date  . Allergic rhinitis   . Anxiety   . Glaucoma   . Smoker   . Vasomotor rhinitis     Patient  Active Problem List   Diagnosis Date Noted  . Seasonal allergic rhinitis due to pollen 01/13/2020  . Rhinorrhea 01/13/2020  . Current smoker 09/21/2011    History reviewed. No pertinent surgical history.   OB History   No obstetric history on file.     No family history on file.  Social History   Tobacco Use  . Smoking status: Current Every Day Smoker    Packs/day: 1.00    Types: Cigarettes  . Smokeless tobacco: Never Used  Substance Use Topics  . Alcohol use: No  . Drug use: No    Home Medications Prior to Admission medications   Medication Sig Start Date End Date Taking? Authorizing Provider  albuterol (VENTOLIN HFA) 108 (90 Base) MCG/ACT inhaler Inhale 2 puffs into the lungs every 6 (six) hours as needed for wheezing or shortness of breath. 01/23/19  Yes Tysinger, Kermit Balo, PA-C  ALPRAZolam Prudy Feeler) 1 MG tablet Take 1 mg by mouth 4 (four) times daily as needed for anxiety.  01/31/20  Yes [provider]  albuterol (VENTOLIN HFA) 108 (90 Base) MCG/ACT inhaler Inhale 2 puffs into the lungs every 6 (six) hours as needed for wheezing or shortness of breath. Patient not taking: Reported on 02/26/2020 07/23/19   Tysinger, Kermit Balo, PA-C  amoxicillin (AMOXIL) 875 MG tablet Take 1 tablet (875 mg total) by mouth 2 (two) times daily. Patient not taking: Reported on 02/25/2020 01/13/20  Tysinger, Kermit Balo, PA-C  cetirizine (ZYRTEC) 10 MG tablet Take 1 tablet (10 mg total) by mouth at bedtime. Patient not taking: Reported on 02/25/2020 01/13/20   Tysinger, Kermit Balo, PA-C  fluticasone [facility]) 50 MCG/ACT nasal spray Place 2 sprays into both nostrils daily. Patient not taking: Reported on 02/26/2020 01/13/20   Tysinger, Kermit Balo, PA-C    Allergies    Patient has no known allergies.  Review of Systems   Review of Systems  Constitutional: Negative for fever.  Respiratory: Negative for cough and shortness of breath.   Cardiovascular: Positive for chest pain. Negative for palpitations  and leg swelling.  Gastrointestinal: Negative for abdominal pain, nausea and vomiting.  Genitourinary: Negative for dysuria.  All other systems reviewed and are negative.   Physical Exam Updated Vital Signs BP (!) 163/97 (BP Location: Right Arm)   Pulse 78   Temp 98.2 F (36.8 C) (Oral)   Resp 13   SpO2 99%   Physical Exam Vitals and nursing note reviewed.  Constitutional:      Appearance: She is well-developed. She is not ill-appearing.  HENT:     Head: Normocephalic and atraumatic.  Eyes:     Pupils: Pupils are equal, round, and reactive to light.  Cardiovascular:     Rate and Rhythm: Normal rate and regular rhythm.     Heart sounds: Normal heart sounds.  Pulmonary:     Effort: Pulmonary effort is normal. No respiratory distress.     Breath sounds: No wheezing.  Abdominal:     General: Bowel sounds are normal.     Palpations: Abdomen is soft.  Musculoskeletal:     Cervical back: Neck supple.     Right lower leg: No tenderness. No edema.     Left lower leg: No tenderness. No edema.  Skin:    General: Skin is warm and dry.  Neurological:     Mental Status: She is alert and oriented to person, place, and time.  Psychiatric:        Mood and Affect: Mood normal.     ED Results / Procedures / Treatments   Labs (all labs ordered are listed, but only abnormal results are displayed) Labs Reviewed  BASIC METABOLIC PANEL  CBC  D-DIMER, QUANTITATIVE (NOT AT [facility])  I-STAT BETA HCG BLOOD, ED ([facility], [facility], [facility] ONLY)  TROPONIN I (HIGH SENSITIVITY)  TROPONIN I (HIGH SENSITIVITY)    EKG EKG Interpretation  Date/Time:  Wednesday Feb 25 2020 14:31:41 EDT Ventricular Rate:  86 PR Interval:  118 QRS Duration: 72 QT Interval:  386 QTC Calculation: 461 R Axis:   88 Text Interpretation: Normal sinus rhythm Right atrial enlargement Nonspecific ST abnormality Abnormal ECG NO prior for comparison No STEMI Confirmed by Ross Marcus ([id number]) on 02/26/2020 2:49:23  AM   Radiology DG Chest 2 View  Result Date: 02/25/2020 CLINICAL DATA:  Chest pain EXAM: CHEST - 2 VIEW COMPARISON:  None. FINDINGS: The heart size and mediastinal contours are within normal limits. Both lungs are clear. The visualized skeletal structures are unremarkable. IMPRESSION: No active cardiopulmonary disease. Electronically Signed   By: Annia Belt M.D.   On: 02/25/2020 15:16    Procedures Procedures (including critical care time)  Medications Ordered in ED Medications  sodium chloride flush (NS) 0.9 % injection 3 mL (has no administration in time range)    ED Course  I have reviewed the triage vital signs and the nursing notes.  Pertinent labs & imaging results that were available during  my care of the patient were reviewed by me and considered in my medical decision making (see chart for details).    MDM Rules/Calculators/A&P HEAR Score: 4                     Patient presents with chest pain.  Onset 2 days ago.  Overall nontoxic-appearing and vital signs notable for blood pressure 163/97.  She does have a heart score 4.  However, her symptoms have been ongoing for greater than 24 hours.  EKG is nonischemic.  Chest x-ray shows no evidence of pneumothorax and pneumonia.  Initial troponin is negative.  Low suspicion for PE and patient is low risk.  Will send screening D-dimer.  D-dimer and repeat troponin obtained.  Repeat troponin is stable and less than 5 delta.  D-dimer is also negative effectively ruling out PE.  Feel patient can safely follow-up with cardiology as an outpatient.  Discussed plan with patient who is agreeable.  After history, exam, and medical workup I feel the patient has been appropriately medically screened and is safe for discharge home. Pertinent diagnoses were discussed with the patient. Patient was given return precautions.  Final Clinical Impression(s) / ED Diagnoses Final diagnoses:  Chest pain, unspecified type    Rx / DC Orders ED Discharge  Orders    None       Marea Reasner, Barbette Hair, MD 02/26/20 [phone]

## 2020-02-26 NOTE — ED Notes (Signed)
Patient verbalizes understanding of discharge instructions. Opportunity for questioning and answers were provided. Armband removed by staff, pt discharged from ED stable & ambulatory  

## 2020-02-26 NOTE — ED Notes (Signed)
Lab to run repeat trop

## 2020-02-26 NOTE — ED Notes (Signed)
Pt expressed great dissatisfaction regarding wait time & continued wait time regarding additional labs needed, requested to leave AMA. This RN informed MD of intentions, advised pt that additional labs were done in order to be thorough & ensure pt would be sent home safely. Pt voiced understanding, this RN allowed to draw repeat labs.

## 2020-02-26 NOTE — ED Notes (Signed)
Pt expressing relief from CP, states that she wants to "take a xanax & go to sleep." Pt advised that she could nap/rest in the room, pt states she wants to "go home." MD Horton aware of pt in room, to assess.

## 2020-04-04 ENCOUNTER — Other Ambulatory Visit: Payer: Self-pay | Admitting: Medical

## 2020-06-24 ENCOUNTER — Telehealth: Payer: 59 | Admitting: Family Medicine

## 2020-06-24 ENCOUNTER — Other Ambulatory Visit (INDEPENDENT_AMBULATORY_CARE_PROVIDER_SITE_OTHER): Payer: 59

## 2020-06-24 ENCOUNTER — Other Ambulatory Visit: Payer: Self-pay

## 2020-06-24 ENCOUNTER — Encounter: Payer: Self-pay | Admitting: Family Medicine

## 2020-06-24 ENCOUNTER — Telehealth: Payer: Self-pay | Admitting: *Deleted

## 2020-06-24 ENCOUNTER — Encounter: Payer: Self-pay | Admitting: *Deleted

## 2020-06-24 VITALS — Ht 69.0 in | Wt 135.0 lb

## 2020-06-24 DIAGNOSIS — R5383 Other fatigue: Secondary | ICD-10-CM

## 2020-06-24 DIAGNOSIS — R059 Cough, unspecified: Secondary | ICD-10-CM

## 2020-06-24 DIAGNOSIS — J3489 Other specified disorders of nose and nasal sinuses: Secondary | ICD-10-CM

## 2020-06-24 DIAGNOSIS — J302 Other seasonal allergic rhinitis: Secondary | ICD-10-CM | POA: Diagnosis not present

## 2020-06-24 DIAGNOSIS — F17200 Nicotine dependence, unspecified, uncomplicated: Secondary | ICD-10-CM | POA: Diagnosis not present

## 2020-06-24 DIAGNOSIS — R0981 Nasal congestion: Secondary | ICD-10-CM

## 2020-06-24 DIAGNOSIS — R05 Cough: Secondary | ICD-10-CM

## 2020-06-24 DIAGNOSIS — Z20822 Contact with and (suspected) exposure to covid-19: Secondary | ICD-10-CM

## 2020-06-24 DIAGNOSIS — Z7185 Encounter for immunization safety counseling: Secondary | ICD-10-CM

## 2020-06-24 DIAGNOSIS — Z7189 Other specified counseling: Secondary | ICD-10-CM

## 2020-06-24 LAB — POCT INFLUENZA A/B
Influenza A, POC: NEGATIVE
Influenza B, POC: NEGATIVE

## 2020-06-24 LAB — POC COVID19 BINAXNOW: SARS Coronavirus 2 Ag: NEGATIVE

## 2020-06-24 NOTE — Patient Instructions (Signed)
Drink plenty of water. Use flonase 2 sprays into each nostril daily, every day. Restart use of zyrtec or other allergy medication. You may use a decongestant such as sudafed if needed for sinus pain. You can also try sinus rinses (ie Neti-Pot) if needed for sinus pain. We discussed using guaifenesin (expectorant found in mucinex and robitussin) to help loosen mucus in sinuses and throat/chest.  We discussed using dextromethorphan (cough suppressant--such as in the DM version of robitussin or mucinex, or in Delsym syrup) only if cough got worse. Smoking increases the risks of developing into secondary bacterial infection such as bronchitis, pneumonia, sinus infections.  Please work on quitting smoking.  Your COVID test (rapid) was negative.  The PCR test will take 2 days to get the result, and you should stay home/quarantine until that result is back.  Your symptoms sound like allergies, but given your potential COVID exposures, it is best to play it safe and wait until your test is back before exposing others.    Follow up or seek care through urgent care if your symptoms worsen.  I encourage you to consider COVID vaccination, as this significantly reduces the risk of serious illness, hospitalization and death from COVID infection.  Someone from the office will be contacting you to schedule you for a physical with your primary care doctor, Dr. Susann Givens. We want to make sure that you are getting all of your preventative care (proper immunizations, including tetanus, flu shingles, colonoscopy, paps, etc)

## 2020-06-24 NOTE — Telephone Encounter (Signed)
Called patient to let her know that her rapid covid test was negative.

## 2020-06-24 NOTE — Progress Notes (Signed)
Start time: 2:29 End time: 2:55  Virtual Visit via Video Note  I connected with Madeline Cervantes on 06/24/20 at  3:15 PM EDT by a video enabled telemedicine application and verified that I am speaking with the correct person using two identifiers.  Location: Patient: at home Provider: office   I discussed the limitations of evaluation and management by telemedicine and the availability of in person appointments. The patient expressed understanding and agreed to proceed.  History of Present Illness: Chief Complaint  Patient presents with  . Sore Throat    VIRTUAL sore throat, runny nose, HA and she said her nose is stuffed up. Has been feeling badly all week. Feels very fatigued today, wants to sleep. Someone at work tested postive for COVID yesterday and she found out today. Has slight cough.    Sunday (4 days ago) she started with headache, fatigue, feeling thirsty. The following day her symptoms persisted, 2 days ago they got worse, now having congestion between her eyes, postnasal drainage and sore throat.   She usually gets this with weather changes. She was seen 01/2020 by Dr. Susann Givens with allergies.  She woke up today at 1:30, tired, head hurts, "everything is draining". Mucus is clear.  Phlegm is clear, "mucus balls", maybe slight yellow in it. She was texted this morning about a worker with +COVID. 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  She has used Visine for itchy eyes. She used Flonase Sunday, just once. Not currently taking any antihistamines. Used Aleve for headaches.   PMH, PSH, SH reviewed  Smoker She has not had her COVID vaccines and isn't interested in them.  Outpatient Encounter Medications as of 06/24/2020  Medication Sig  . ALPRAZolam (XANAX) 1 MG tablet Take 1 mg by mouth 4 (four) times daily as  needed for anxiety.   Marland Kitchen VIIBRYD 40 MG TABS Take 40 mg by mouth daily.  Marland Kitchen albuterol (VENTOLIN HFA) 108 (90 Base) MCG/ACT inhaler Inhale 2 puffs into the lungs every 6 (six) hours as needed for wheezing or shortness of breath. (Patient not taking: Reported on 02/26/2020)  . cetirizine (ZYRTEC) 10 MG tablet Take 1 tablet (10 mg total) by mouth at bedtime. (Patient not taking: Reported on 02/25/2020)  . fluticasone (FLONASE) 50 MCG/ACT nasal spray SPRAY 2 SPRAYS INTO EACH NOSTRIL EVERY DAY (Patient not taking: Reported on 06/24/2020)  . [DISCONTINUED] albuterol (VENTOLIN HFA) 108 (90 Base) MCG/ACT inhaler Inhale 2 puffs into the lungs every 6 (six) hours as needed for wheezing or shortness of breath.  . [DISCONTINUED] amoxicillin (AMOXIL) 875 MG tablet Take 1 tablet (875 mg total) by mouth 2 (two) times daily. (Patient not taking: Reported on 02/25/2020)   No facility-administered encounter medications on file as of 06/24/2020.   No Known Allergies  ROS: No fever, chills. +nasal congestion, sinus pressure, PND, sore throat and cough, per HPI.  +itchy eyes, nose, sneezing.   No diarrhea, no body aches, no loss of taste/smell +thirst No rashes, bleeding, bruising. Denies chest pain, tightness, shortness of breath.  Has albuterol at home, used it once Sunday    Observations/Objective:  Ht 5\' 9"  (1.753 m)   Wt 135 lb (61.2 kg)   BMI 19.94 kg/m   Pleasant female, appears tired but otherwise in no distress She is alert and oriented. She is speaking comfortably, in no  distress, without any throat-clearing or coughing.  Exam is limited due to virtual nature of the visit.  Assessment and Plan:  Seasonal allergic rhinitis, unspecified trigger - Resume daily Flonase and zyrtec. Mucinex prn, sudafed prn sinus pain.  Cough  Nasal congestion  Smoker - counseled re: increased risks of bacterial infections, encouraged cessation  Exposure to COVID-19 virus - may not be close contact, but given sx,  will test for COVID  Vaccine counseling - Counseled re: COVID vaccine--pt reports she will not take this vaccine.   Does not meet criteria for monoclonal antibody infusion if COVID+  Rapid COVID and influenza tests were negative.  PCR pending. 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  Schedule CPE with [facility]--chart reviewed.  There are no documented immunizations, colonoscopy, or other HM items. D/W pt the need for routine care and CPE with her PCP.   Follow Up Instructions:    I discussed the assessment and treatment plan with the patient. The patient was provided an opportunity to ask questions and all were answered. The patient agreed with the plan and demonstrated an understanding of the instructions.   The patient was advised to call back or seek an in-person evaluation if the symptoms worsen or if the condition fails to improve as anticipated.  I provided 26 minutes of video face-to-face time during this encounter. Additional time spent in chart review and documentation (>33 minutes total time)   Lavonda Jumbo, MD

## 2020-06-25 LAB — NOVEL CORONAVIRUS, NAA: SARS-CoV-2, NAA: NOT DETECTED

## 2020-06-25 LAB — SARS-COV-2, NAA 2 DAY TAT

## 2020-07-07 ENCOUNTER — Encounter: Payer: Self-pay | Admitting: Family Medicine

## 2020-07-07 ENCOUNTER — Other Ambulatory Visit: Payer: Self-pay

## 2020-07-07 ENCOUNTER — Ambulatory Visit: Payer: 59 | Admitting: Family Medicine

## 2020-07-07 VITALS — BP 96/62 | HR 92 | Temp 98.8°F | Wt 133.0 lb

## 2020-07-07 DIAGNOSIS — R5383 Other fatigue: Secondary | ICD-10-CM

## 2020-07-07 DIAGNOSIS — M791 Myalgia, unspecified site: Secondary | ICD-10-CM

## 2020-07-07 NOTE — Progress Notes (Signed)
   Subjective:    Patient ID: Madeline Cervantes, female    DOB: [birth year], 49 y.o.   MRN: [id number]  HPI She complains of a 1 month history of fatigue as well as foggy mental sensation.  She states that her neck and hurt her when she yawns.  She also complains of diffuse myalgias, upper extremities greater than lower.  She was exposed to Covid in the past and had a negative test.  She is not interested in the vaccine.  She has had no nausea, vomiting, abdominal or urinary symptoms.  She states that she is under no undue stress.   Review of Systems     Objective:   Physical Exam Alert and in no distress.  DTRs are normal tympanic membranes and canals are normal. Pharyngeal area is normal. Neck is supple without adenopathy or thyromegaly. Cardiac exam shows a regular sinus rhythm without murmurs or gallops. Lungs are clear to auscultation.        Assessment & Plan:  Fatigue, unspecified type - Plan: CBC with Differential/Platelet, Comprehensive metabolic panel, Sedimentation rate  Myalgia - Plan: CBC with Differential/Platelet, Comprehensive metabolic panel, Sedimentation rate I explained that at the present time I have no good reason as to why she is having this.

## 2020-07-08 LAB — COMPREHENSIVE METABOLIC PANEL
ALT: 15 IU/L (ref 0–32)
AST: 13 IU/L (ref 0–40)
Albumin/Globulin Ratio: 1.8 (ref 1.2–2.2)
Albumin: 4.6 g/dL (ref 3.8–4.9)
Alkaline Phosphatase: 95 IU/L (ref 44–121)
BUN/Creatinine Ratio: 24 — ABNORMAL HIGH (ref 9–23)
BUN: 14 mg/dL (ref 6–24)
Bilirubin Total: 0.5 mg/dL (ref 0.0–1.2)
CO2: 24 mmol/L (ref 20–29)
Calcium: 9.4 mg/dL (ref 8.7–10.2)
Chloride: 102 mmol/L (ref 96–106)
Creatinine, Ser: 0.58 mg/dL (ref 0.57–1.00)
GFR calc Af Amer: 123 mL/min/{1.73_m2} (ref >59)
GFR calc non Af Amer: 107 mL/min/{1.73_m2} (ref >59)
Globulin, Total: 2.5 g/dL (ref 1.5–4.5)
Glucose: 91 mg/dL (ref 65–99)
Potassium: 4.3 mmol/L (ref 3.5–5.2)
Sodium: 140 mmol/L (ref 134–144)
Total Protein: 7.1 g/dL (ref 6.0–8.5)

## 2020-07-08 LAB — CBC WITH DIFFERENTIAL/PLATELET
Basophils Absolute: 0 10*3/uL (ref 0.0–0.2)
Basos: 0 %
EOS (ABSOLUTE): 0.1 10*3/uL (ref 0.0–0.4)
Eos: 1 %
Hematocrit: 42.6 % (ref 34.0–46.6)
Hemoglobin: 14.8 g/dL (ref 11.1–15.9)
Immature Grans (Abs): 0.1 10*3/uL (ref 0.0–0.1)
Immature Granulocytes: 1 %
Lymphocytes Absolute: 2.6 10*3/uL (ref 0.7–3.1)
Lymphs: 26 %
MCH: 31.8 pg (ref 26.6–33.0)
MCHC: 34.7 g/dL (ref 31.5–35.7)
MCV: 92 fL (ref 79–97)
Monocytes Absolute: 0.7 10*3/uL (ref 0.1–0.9)
Monocytes: 7 %
Neutrophils Absolute: 6.7 10*3/uL (ref 1.4–7.0)
Neutrophils: 65 %
Platelets: 269 10*3/uL (ref 150–450)
RBC: 4.65 x10E6/uL (ref 3.77–5.28)
RDW: 12.8 % (ref 11.7–15.4)
WBC: 10.2 10*3/uL (ref 3.4–10.8)

## 2020-07-08 LAB — SEDIMENTATION RATE: Sed Rate: 43 mm/h — ABNORMAL HIGH (ref 0–40)

## 2020-07-09 NOTE — Progress Notes (Signed)
Called and left message with pt

## 2021-01-13 ENCOUNTER — Telehealth: Payer: 59 | Admitting: Family Medicine

## 2021-01-13 ENCOUNTER — Other Ambulatory Visit: Payer: Self-pay

## 2021-01-13 VITALS — Ht 68.0 in | Wt 125.0 lb

## 2021-01-13 DIAGNOSIS — K529 Noninfective gastroenteritis and colitis, unspecified: Secondary | ICD-10-CM | POA: Diagnosis not present

## 2021-01-13 MED ORDER — ONDANSETRON 4 MG PO TBDP: 4.0000 mg | ORAL_TABLET | Freq: Three times a day (TID) | ORAL | 0 refills | Status: DC | PRN

## 2021-01-13 NOTE — Progress Notes (Signed)
   Subjective:    Patient ID: Madeline Cervantes, female    DOB: 12/23/1970, 50 y.o.   MRN: [id number]  HPI Documentation for virtual audio and video telecommunications through Caregility encounter: The patient was located at home. 2 patient identifiers used.  The provider was located in the office. The patient did consent to this visit and is aware of possible charges through their insurance for this visit. The other persons participating in this telemedicine service were none. Time spent on call was 5 minutes and in review of previous records >20 minutes total for counseling and coordination of care. This virtual service is not related to other E/M service within previous 7 days. She states that 2 days ago she started having difficulty with fever and chills, vomiting and diarrhea as well as abdominal cramping with the diarrhea.  She says that it has slowed down slightly in the last several hours.  No other people that she has been around has had difficulty.  Review of Systems     Objective:   Physical Exam Alert and in no distress otherwise not examined       Assessment & Plan:  Acute gastroenteritis - Plan: ondansetron (ZOFRAN ODT) 4 MG disintegrating tablet I explained that her symptoms sound like viral gastroenteritis.  Recommend using the Zofran to get her nausea and vomiting under control and then slowly add fluids back to build up her hydration.  She can then eat what ever is good to her.  She is also to use Lomotil up to 8 pills/day.  She expressed understanding of this.

## 2021-01-17 ENCOUNTER — Telehealth: Payer: Self-pay

## 2021-01-17 ENCOUNTER — Encounter: Payer: Self-pay | Admitting: Family Medicine

## 2021-01-17 NOTE — Telephone Encounter (Signed)
Pt. Called stating that she saw you virtually on Thursday last week and forgot to ask for a doctor's note she missed work 01/13/21 and 01/14/21 due to vomiting and diarrhea. If ok I can write her the doctor's note and email to her.

## 2021-01-17 NOTE — Telephone Encounter (Signed)
Letter sent to pt. Thru mychart. 

## 2021-01-17 NOTE — Telephone Encounter (Signed)
yes

## 2021-03-25 IMAGING — DX DG CHEST 2V
2 series · 2 of 2 positions shown · non-contrast
Comparison: None.

CLINICAL DATA: Chest pain

EXAM:
CHEST - 2 VIEW

[w chest pa]
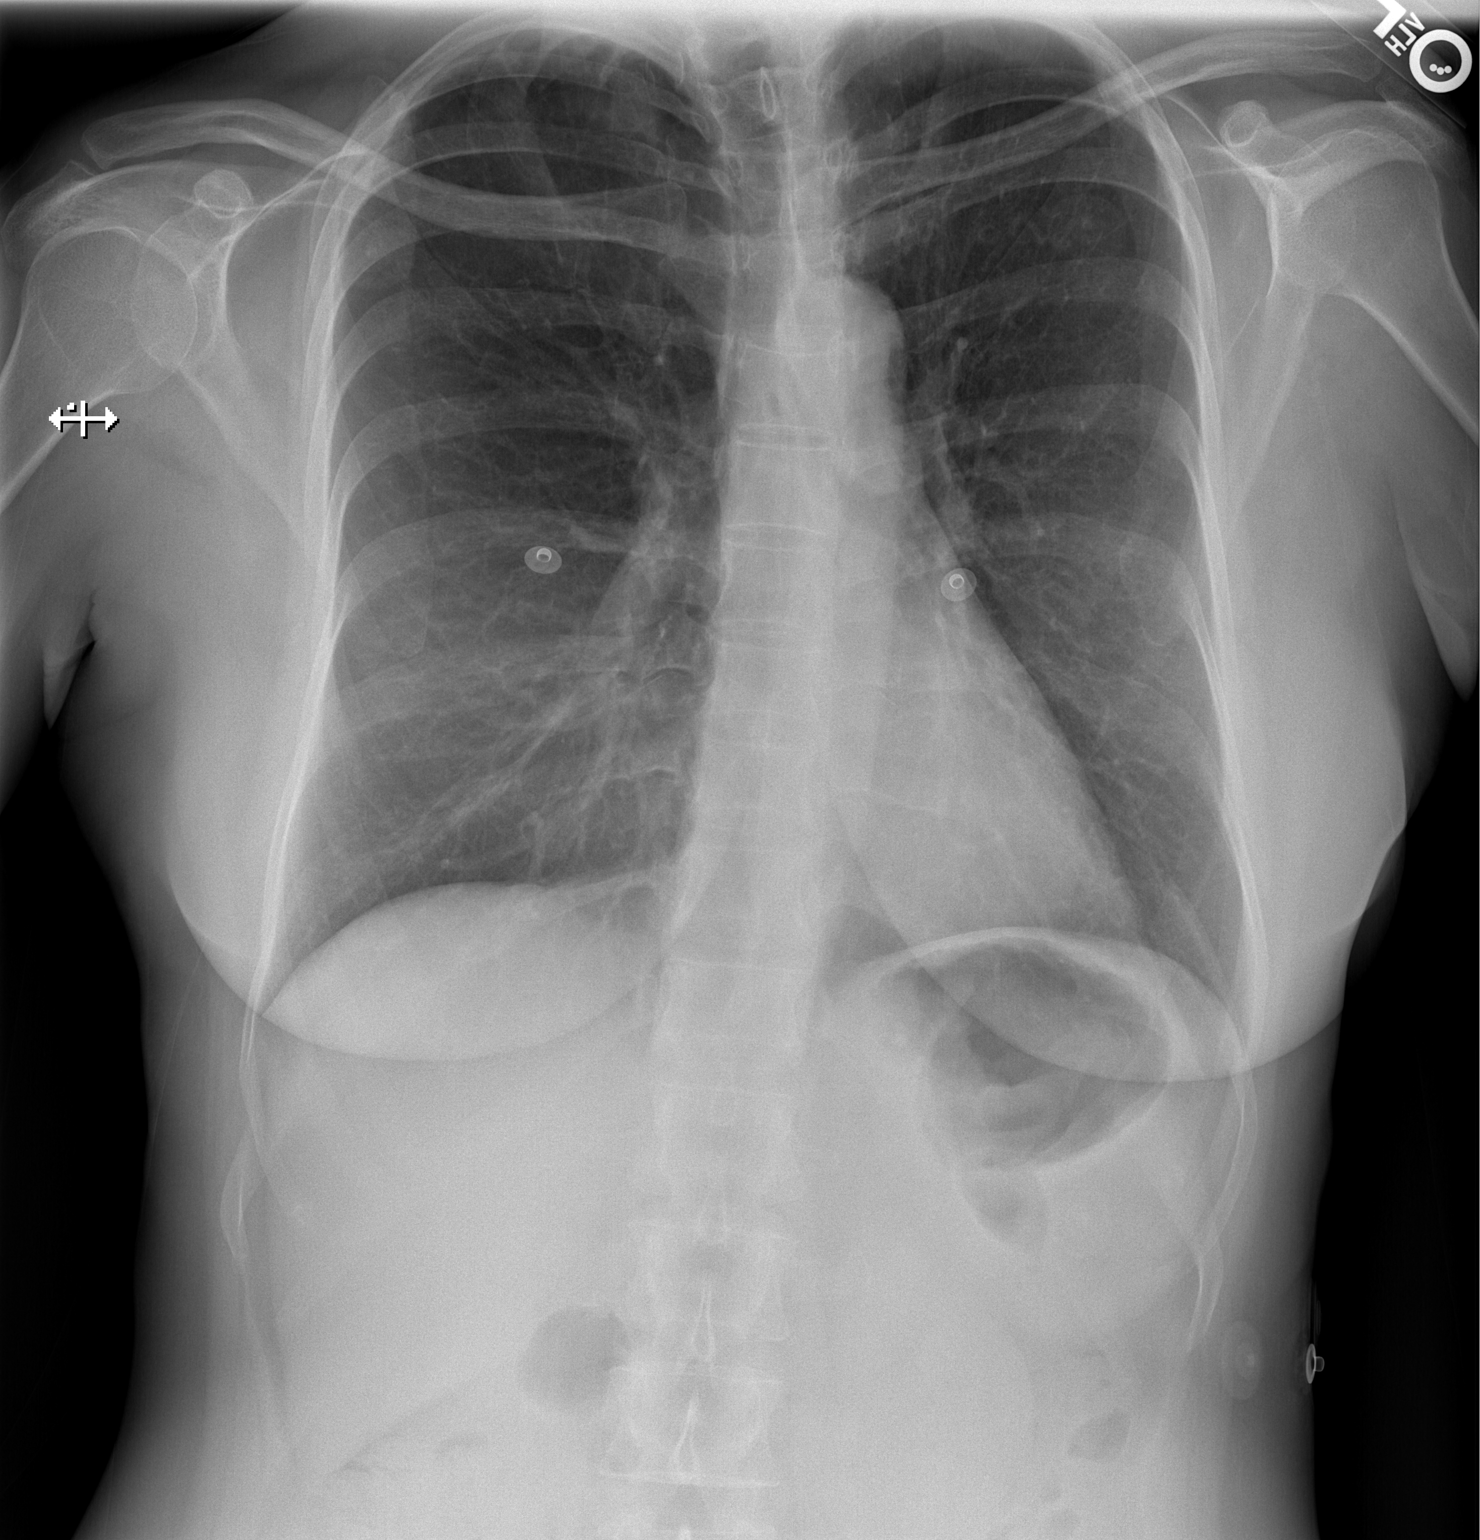

[w chest lat]
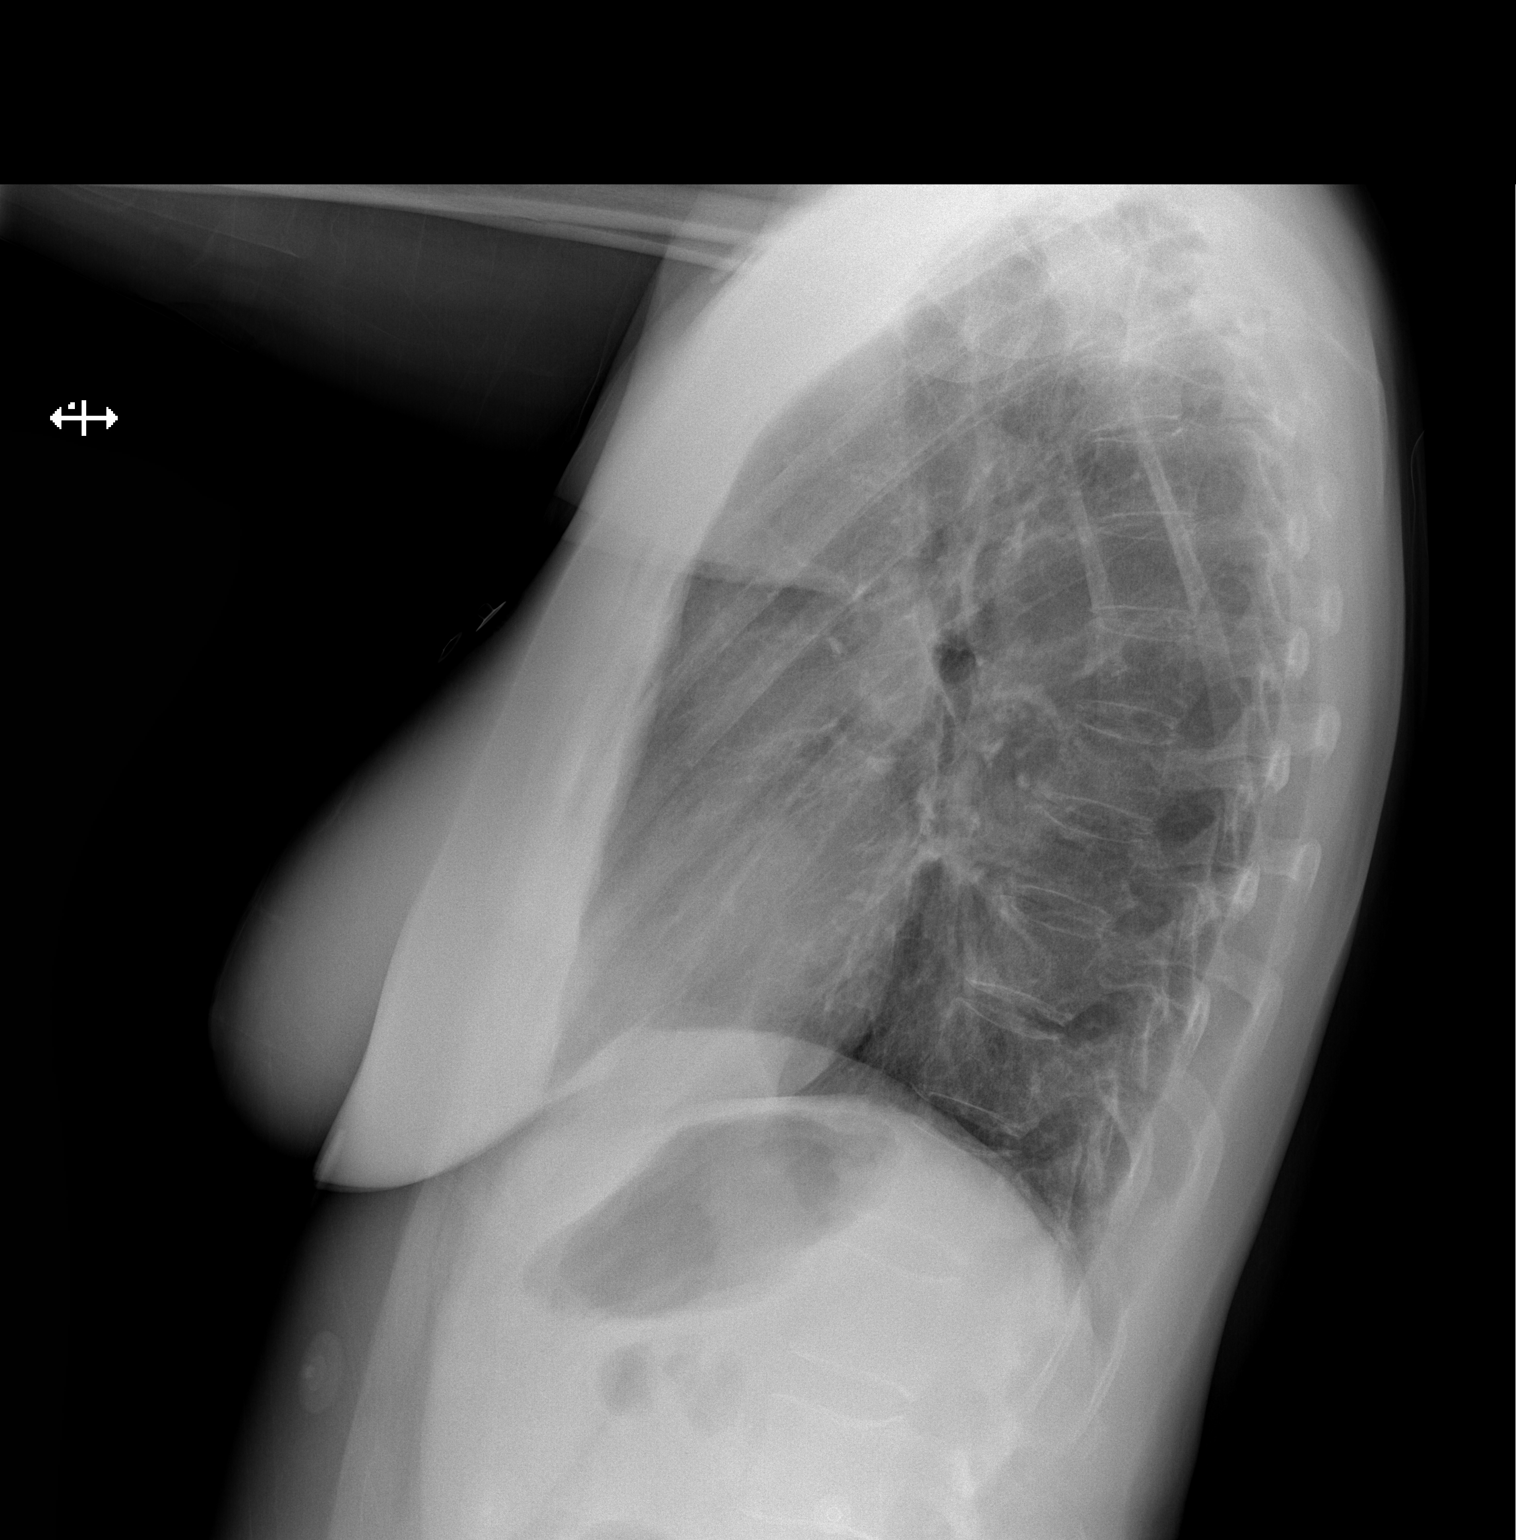

[2 of 2 positions shown; findings below may reference images not displayed]

FINDINGS: The heart size and mediastinal contours are within normal limits.
Both lungs are clear. The visualized skeletal structures are
unremarkable.
IMPRESSION: No active cardiopulmonary disease.

## 2022-02-20 ENCOUNTER — Telehealth: Payer: Self-pay

## 2022-02-20 NOTE — Telephone Encounter (Signed)
Called pt to advised that it is time to schedule a med check or annual exam. [facility]

## 2022-06-07 ENCOUNTER — Encounter: Payer: Self-pay | Admitting: Internal Medicine

## 2022-06-22 ENCOUNTER — Ambulatory Visit: Payer: 59 | Admitting: Family Medicine

## 2022-06-22 ENCOUNTER — Encounter: Payer: Self-pay | Admitting: Family Medicine

## 2022-06-22 VITALS — BP 118/72 | HR 72 | Temp 98.9°F | Ht 68.0 in | Wt 114.4 lb

## 2022-06-22 DIAGNOSIS — F17200 Nicotine dependence, unspecified, uncomplicated: Secondary | ICD-10-CM

## 2022-06-22 DIAGNOSIS — J069 Acute upper respiratory infection, unspecified: Secondary | ICD-10-CM | POA: Diagnosis not present

## 2022-06-22 DIAGNOSIS — J029 Acute pharyngitis, unspecified: Secondary | ICD-10-CM | POA: Diagnosis not present

## 2022-06-22 LAB — POCT RAPID STREP A (OFFICE): Rapid Strep A Screen: NEGATIVE

## 2022-06-22 NOTE — Progress Notes (Signed)
Chief Complaint  Patient presents with   Lymphadenopathy    Swollen glands/ST and  B/L ear pain. Non productive cough, not much drainage. Was having slight fever and chills, but not today. Also has a HA. Symptoms started Tues night 10pm at work. Yesterday and today covid tests were negative. Tele-doc appt yesterday was given amoxil 875 and tessalon and is not feeling any better. Having issues eating or drinking because she cannot swallow.    10pm on 9/19 she started with chills, fatigue, sore throat, headache.  9/20 she started with bilateral ear pain.  She has persistent sore throat. She is getting up stringy mucus from the throat, discolored. It hurts her throat too much to blow her nose, but when she is able to blow, notices "greenish yellow slime". She has pain in her entire head--jaw (upper and lower), and also across her forehead.  No wheezes or shortness of breath. Not coughing much. Smoker.  No n/v/d +tactile fevers, chills.  Salt water gargles help temporarily. Only took 200mg  ibuprofen this morning.  No sick contacts. Negative COVID yesterday and today. Tele-doc visit yesterday, was prescribed Amoxil and tessalon.    PMH, PSH, SH reviewed  Outpatient Encounter Medications as of 06/22/2022  Medication Sig Note   ALPRAZolam (XANAX) 1 MG tablet Take 1 mg by mouth 4 (four) times daily as needed for anxiety.  06/22/2022: Took one this at 6:30a,   amoxicillin (AMOXIL) 875 MG tablet Take 875 mg by mouth 2 (two) times daily.    benzonatate (TESSALON) 200 MG capsule Take 200 mg by mouth 3 (three) times daily as needed.    fluticasone (FLONASE) 50 MCG/ACT nasal spray SPRAY 2 SPRAYS INTO EACH NOSTRIL EVERY DAY    VIIBRYD 40 MG TABS Take 40 mg by mouth daily.    albuterol (VENTOLIN HFA) 108 (90 Base) MCG/ACT inhaler Inhale 2 puffs into the lungs every 6 (six) hours as needed for wheezing or shortness of breath. (Patient not taking: Reported on 02/26/2020)    [DISCONTINUED] azithromycin  (ZITHROMAX) 250 MG tablet TAKE 2 TABLETS BY MOUTH TODAY, THEN TAKE 1 TABLET DAILY FOR 4 DAYS    [DISCONTINUED] cetirizine (ZYRTEC) 10 MG tablet Take 1 tablet (10 mg total) by mouth at bedtime. (Patient not taking: Reported on 01/13/2021)    [DISCONTINUED] ondansetron (ZOFRAN ODT) 4 MG disintegrating tablet Take 1 tablet (4 mg total) by mouth every 8 (eight) hours as needed for nausea or vomiting.    [DISCONTINUED] promethazine-dextromethorphan (PROMETHAZINE-DM) 6.25-15 MG/5ML syrup TAKE 5 ML EVERY 4 HOURS BY ORAL ROUTE AT BEDTIME.    No facility-administered encounter medications on file as of 06/22/2022.   No Known Allergies  ROS:  URI symptoms per above, tactile f/c. No n/v/d, rashes, chest pain, or other concerns except as noted above.  PHYSICAL EXAM:  BP 118/72   Pulse 72   Temp 98.9 F (37.2 C) (Tympanic)   Ht 5\' 8"  (1.727 m)   Wt 114 lb 6.4 oz (51.9 kg)   BMI 17.39 kg/m   Well-appearing female. She is speaking comfortably, normal voice. HEENT: conjunctiva and sclera are clear, EOMI. Nasal mucosa is mildly edematous, no erythema or purulence. Sinuses nontender OP--tonsils are not enlarged, no erythema or exudate. No soft tissue swelling, throat appears normal. TM's and EAC's normal. Neck: she is tender at the anterior throat (over submandibular glands). No overlying erythema or warmth.  There is no lymphadenopathy or mass Heart: regular rate and rhythm Lungs: clear bilaterally  Rapid strep negative  ASSESSMENT/PLAN:   Upper respiratory tract infection, unspecified type - on amoxil.  smoker, suspect poss sinus infection based on drainage color (vs viral). Throat is normal.  Complete ABX, f/u if worse.Supportive measures discussed  Sore throat - suspect related to PND, given out of proportion feeling of swelling/pain, with relatively normal exam - Plan: Rapid Strep A  Smoker - cessation encouraged

## 2022-06-22 NOTE — Patient Instructions (Addendum)
Your strep test was negative, and your throat appears normal. I suspect that your sore throat is related to postnasal drainage.  You can try some sinus rinses. Drink lots of water. Take mucinex (guaifenesin) to help thin out the secretions (mucus and phlegm). Continue the amoxil, which should cover for a bacterial infection (sinuses).  Use tylenol and/or ibuprofen as needed for pain. You can take 3 ibuprofen at a time, with food, three times daily, if needed. You can also try salt water gargles, or chloraseptic spray as needed for throat pain.  Please try and work on quitting smoking (since it hurts too much to smoke anyway!)

## 2022-07-11 ENCOUNTER — Encounter: Payer: Self-pay | Admitting: Internal Medicine

## 2022-07-24 ENCOUNTER — Encounter: Payer: Self-pay | Admitting: Internal Medicine

## 2022-12-10 ENCOUNTER — Encounter (HOSPITAL_BASED_OUTPATIENT_CLINIC_OR_DEPARTMENT_OTHER): Payer: Self-pay | Admitting: Emergency Medicine

## 2022-12-10 ENCOUNTER — Emergency Department (HOSPITAL_BASED_OUTPATIENT_CLINIC_OR_DEPARTMENT_OTHER)
Admission: EM | Admit: 2022-12-10 | Discharge: 2022-12-11 | Disposition: A | Discharge status: Home / Self Care | Payer: 59 | Attending: Emergency Medicine | Admitting: Emergency Medicine

## 2022-12-10 ENCOUNTER — Other Ambulatory Visit: Payer: Self-pay

## 2022-12-10 ENCOUNTER — Emergency Department (HOSPITAL_BASED_OUTPATIENT_CLINIC_OR_DEPARTMENT_OTHER): Payer: 59

## 2022-12-10 DIAGNOSIS — H4902 Third [oculomotor] nerve palsy, left eye: Secondary | ICD-10-CM | POA: Diagnosis not present

## 2022-12-10 DIAGNOSIS — H510 Palsy (spasm) of conjugate gaze: Secondary | ICD-10-CM | POA: Diagnosis not present

## 2022-12-10 DIAGNOSIS — H518 Other specified disorders of binocular movement: Secondary | ICD-10-CM

## 2022-12-10 DIAGNOSIS — H02402 Unspecified ptosis of left eyelid: Secondary | ICD-10-CM | POA: Insufficient documentation

## 2022-12-10 DIAGNOSIS — H5789 Other specified disorders of eye and adnexa: Secondary | ICD-10-CM | POA: Diagnosis present

## 2022-12-10 DIAGNOSIS — R269 Unspecified abnormalities of gait and mobility: Secondary | ICD-10-CM | POA: Diagnosis not present

## 2022-12-10 LAB — COMPREHENSIVE METABOLIC PANEL
ALT: 10 U/L (ref 0–44)
AST: 19 U/L (ref 15–41)
Albumin: 4.2 g/dL (ref 3.5–5.0)
Alkaline Phosphatase: 72 U/L (ref 38–126)
Anion gap: 8 (ref 5–15)
BUN: 19 mg/dL (ref 6–20)
CO2: 25 mmol/L (ref 22–32)
Calcium: 8.6 mg/dL — ABNORMAL LOW (ref 8.9–10.3)
Chloride: 101 mmol/L (ref 98–111)
Creatinine, Ser: 1.03 mg/dL — ABNORMAL HIGH (ref 0.44–1.00)
GFR, Estimated: >60 mL/min (ref >60)
Glucose, Bld: 155 mg/dL — ABNORMAL HIGH (ref 70–99)
Potassium: 3.8 mmol/L (ref 3.5–5.1)
Sodium: 134 mmol/L — ABNORMAL LOW (ref 135–145)
Total Bilirubin: 0.7 mg/dL (ref 0.3–1.2)
Total Protein: 7.5 g/dL (ref 6.5–8.1)

## 2022-12-10 LAB — CBC WITH DIFFERENTIAL/PLATELET
Abs Immature Granulocytes: 0.01 10*3/uL (ref 0.00–0.07)
Basophils Absolute: 0 10*3/uL (ref 0.0–0.1)
Basophils Relative: 0 %
Eosinophils Absolute: 0.1 10*3/uL (ref 0.0–0.5)
Eosinophils Relative: 1 %
HCT: 40.6 % (ref 36.0–46.0)
Hemoglobin: 14 g/dL (ref 12.0–15.0)
Immature Granulocytes: 0 %
Lymphocytes Relative: 34 %
Lymphs Abs: 2.5 10*3/uL (ref 0.7–4.0)
MCH: 31.9 pg (ref 26.0–34.0)
MCHC: 34.5 g/dL (ref 30.0–36.0)
MCV: 92.5 fL (ref 80.0–100.0)
Monocytes Absolute: 0.5 10*3/uL (ref 0.1–1.0)
Monocytes Relative: 6 %
Neutro Abs: 4.3 10*3/uL (ref 1.7–7.7)
Neutrophils Relative %: 59 %
Platelets: 249 10*3/uL (ref 150–400)
RBC: 4.39 MIL/uL (ref 3.87–5.11)
RDW: 13 % (ref 11.5–15.5)
WBC: 7.4 10*3/uL (ref 4.0–10.5)
nRBC: 0 % (ref 0.0–0.2)

## 2022-12-10 LAB — CBG MONITORING, ED: Glucose-Capillary: 165 mg/dL — ABNORMAL HIGH (ref 70–99)

## 2022-12-10 LAB — PROTIME-INR
INR: 0.9 (ref 0.8–1.2)
Prothrombin Time: 12.5 seconds (ref 11.4–15.2)

## 2022-12-10 MED ORDER — IOHEXOL 350 MG/ML SOLN
75.0000 mL | Freq: Once | INTRAVENOUS | Status: AC | PRN
  Administered 2022-12-10: 75 mL via INTRAVENOUS

## 2022-12-10 NOTE — ED Provider Notes (Incomplete)
[facility] Provider Note   CSN: [id number] Arrival date & time: 12/10/22  1934     History {Add pertinent medical, surgical, social history, OB history to HPI:1} Chief Complaint  Patient presents with   Eye Problem    Madeline Cervantes is a 52 y.o. female.  HPI   51 year old female presents emergency department with concern for abnormal left eye and eyelid drooping.  Patient is accompanied by her son.  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  Today he noticed that the left eye looked abnormal and brought her in for evaluation.  Patient typically wears contacts or glasses.  The last time the son saw the patient normal was 4 days ago.  The patient herself states she has not looked at herself in the mirror lately and does not know when the change with the eye would have happened.  She states it feels like there is a film over her left eye vision but at this time denies any double vision or vision loss.  She does endorse a slight burning discomfort involving both eyes as well as low-grade headache going on for the past couple days.  She also had difficulty with ambulation today secondary to balance.  She states she is under extreme stress in regards to some of her grandchildren and so she has been paying close attention to her symptoms.  She denies any chest pain or other focal weakness/numbness.  Home Medications Prior to Admission medications   Medication Sig Start Date End Date Taking? Authorizing Provider  albuterol (VENTOLIN HFA) 108 (90 Base) MCG/ACT inhaler Inhale 2 puffs into the lungs every 6 (six) hours as needed for wheezing or shortness of breath. Patient not taking: Reported on 02/26/2020 07/23/19   Tysinger, Camelia Eng, PA-C  ALPRAZolam Duanne Moron) 1 MG tablet Take 1 mg by mouth 4 (four) times daily as needed for anxiety.  01/31/20   [provider]  amoxicillin (AMOXIL) 875 MG tablet Take 875 mg by mouth 2 (two) times daily.  06/21/22   [provider]  benzonatate (TESSALON) 200 MG capsule Take 200 mg by mouth 3 (three) times daily as needed. 06/21/22   [provider]  fluticasone (FLONASE) 50 MCG/ACT nasal spray SPRAY 2 SPRAYS INTO EACH NOSTRIL EVERY DAY 04/06/20   Denita Lung, MD  VIIBRYD 40 MG TABS Take 40 mg by mouth daily. 06/22/20   [provider]      Allergies    Patient has no known allergies.    Review of Systems   Review of Systems  Constitutional:  Negative for fever.  Eyes:  Positive for visual disturbance.  Respiratory:  Negative for shortness of breath.   Cardiovascular:  Negative for chest pain.  Gastrointestinal:  Negative for abdominal pain, diarrhea and vomiting.  Skin:  Negative for rash.  Neurological:  Positive for dizziness, facial asymmetry and headaches. Negative for speech difficulty and numbness.    Physical Exam Updated Vital Signs BP (!) 186/86 (BP Location: Right Arm)   Pulse 93   Temp 98.4 F (36.9 C) (Oral)   Resp 20   Ht '5\' 8"'$  (1.727 m)   Wt 48.9 kg   SpO2 100%   BMI 16.38 kg/m  Physical Exam Vitals and nursing note reviewed.  Constitutional:      Appearance: Normal appearance.  HENT:     Head: Normocephalic.     Mouth/Throat:     Mouth: Mucous membranes are moist.  Eyes:  Comments: Left eye is mainly in a position of abduction and slightly downward.  There is drooping of the left eyelid.  Cardiovascular:     Rate and Rhythm: Normal rate.  Pulmonary:     Effort: Pulmonary effort is normal. No respiratory distress.  Abdominal:     Palpations: Abdomen is soft.     Tenderness: There is no abdominal tenderness.  Skin:    General: Skin is warm.  Neurological:     General: No focal deficit present.     Mental Status: She is alert and oriented to person, place, and time. Mental status is at baseline.     Gait: Gait abnormal.  Psychiatric:        Mood and Affect: Mood normal.     ED Results / Procedures / Treatments    Labs (all labs ordered are listed, but only abnormal results are displayed) Labs Reviewed  COMPREHENSIVE METABOLIC PANEL - Abnormal; Notable for the following components:      Result Value   Sodium 134 (*)    Glucose, Bld 155 (*)    Creatinine, Ser 1.03 (*)    Calcium 8.6 (*)    All other components within normal limits  CBG MONITORING, ED - Abnormal; Notable for the following components:   Glucose-Capillary 165 (*)    All other components within normal limits  CBC WITH DIFFERENTIAL/PLATELET  PROTIME-INR    EKG None  Radiology No results found.  Procedures Procedures  {Document cardiac monitor, telemetry assessment procedure when appropriate:1}  Medications Ordered in ED Medications  iohexol (OMNIPAQUE) 350 MG/ML injection 75 mL (75 mLs Intravenous Contrast Given 12/10/22 2106)    ED Course/ Medical Decision Making/ A&P   {   Click here for ABCD2, HEART and other calculatorsREFRESH Note before signing :1}                          Medical Decision Making Amount and/or Complexity of Data Reviewed Labs: ordered. Radiology: ordered.  Risk Prescription drug management.   ***  {Document critical care time when appropriate:1} {Document review of labs and clinical decision tools ie heart score, Chads2Vasc2 etc:1}  {Document your independent review of radiology images, and any outside records:1} {Document your discussion with family members, caretakers, and with consultants:1} {Document social determinants of health affecting pt's care:1} {Document your decision making why or why not admission, treatments were needed:1} Final Clinical Impression(s) / ED Diagnoses Final diagnoses:  None    Rx / DC Orders ED Discharge Orders     None

## 2022-12-10 NOTE — ED Triage Notes (Signed)
Pt reports BIL eye pain "for a couple days" and feeling like there was a film over part of her L eye. Today while working with her son he noticed her L eye was deviated to the L and upward, and he stated he felt like that eye looked droopy compared to baseline. EDP called to triage to assess. Pt speaking clearly, but states she's "walking like she's drunk". Unsure of LKW. Son noticed the eye issue ~ 30 min PTA but says "that could have been that way for a while."

## 2022-12-11 ENCOUNTER — Emergency Department (HOSPITAL_COMMUNITY): Payer: 59

## 2022-12-11 MED ORDER — GADOBUTROL 1 MMOL/ML IV SOLN
5.0000 mL | Freq: Once | INTRAVENOUS | Status: AC | PRN
  Administered 2022-12-11: 5 mL via INTRAVENOUS

## 2022-12-11 MED ORDER — MIDAZOLAM HCL 2 MG/2ML IJ SOLN
1.0000 mg | Freq: Once | INTRAMUSCULAR | Status: AC
  Administered 2022-12-11: 1 mg via INTRAVENOUS
  Filled 2022-12-11: qty 2

## 2022-12-11 NOTE — ED Provider Notes (Signed)
Received patient as a transfer for further evaluation.  Patient with complaints of double vision, found to have symptoms concerning for oculomotor palsy.  Transferred for MRI.  Physical Exam  BP (!) 103/91   Pulse 78   Temp 98.4 F (36.9 C) (Oral)   Resp 16   Ht '5\' 8"'$  (1.727 m)   Wt 48.9 kg   SpO2 96%   BMI 16.38 kg/m   Physical Exam Constitutional:      Appearance: Normal appearance.  HENT:     Head: Normocephalic and atraumatic.  Eyes:     Extraocular Movements:     Left eye: Abnormal extraocular motion present.     Comments: Left sided ptosis Left eye slightly down and out at rest Patient able to move eye inwards, outwards and up and down, but left eye lags behind right  Neurological:     Mental Status: She is alert.     Procedures  Procedures  ED Course / MDM    Medical Decision Making Amount and/or Complexity of Data Reviewed Labs: ordered. Radiology: ordered.  Risk Prescription drug management.   Transferred for MRI to evaluate extraocular muscle abnormality.  Patient underwent MRI brain with and without contrast.  No evidence of stroke, mass or demyelinating disease.  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  Will refer to neurology and ophthalmology for outpatient evaluation.       Orpah Greek, MD 12/11/22 [phone]

## 2022-12-12 ENCOUNTER — Encounter: Payer: Self-pay | Admitting: Family Medicine

## 2022-12-12 ENCOUNTER — Telehealth: Payer: 59 | Admitting: Family Medicine

## 2022-12-12 DIAGNOSIS — H4902 Third [oculomotor] nerve palsy, left eye: Secondary | ICD-10-CM | POA: Diagnosis not present

## 2022-12-12 NOTE — Progress Notes (Signed)
   Subjective:    Patient ID: Madeline Cervantes, female    DOB: [birth year], 52 y.o.   MRN: [id number]  HPI Documentation for virtual audio and video telecommunications through [facility] encounter:  The patient was located at home. 2 patient identifiers used.  The provider was located in the office. The patient did consent to this visit and is aware of possible charges through their insurance for this visit. The other persons participating in this telemedicine service were none. Time spent on call was 5 minutes and in review of previous records >20 minutes total for counseling and coordination of care. This virtual service is not related to other E/M service within previous 7 days.  She was seen March 10 in the emergency room for difficulty with left eye.  The emergency room record including MRI and the notes was reviewed.  She does have a 3rd nerve palsy.  She was given referrals to neurology and ophthalmology.  She was given a note for out of work covered for today but she has a job requiring better depth perception and I will therefore cover her until she can be seen by ophthalmology and neurology.  Review of Systems     Objective:   Physical Exam Alert and in no distress.  Ptosis noted of the left eye.       Assessment & Plan:  3rd cranial nerve palsy, left Recommend she call Caryn at [phone] to determine when she will be referred to neurology.  She is to call her ophthalmologist Dr. Talbert Forest and get an appointment to see him or one of his partners.  I will cover her until she can be seen for more definitive care on this.

## 2022-12-18 ENCOUNTER — Encounter: Payer: Self-pay | Admitting: Diagnostic Neuroimaging

## 2022-12-18 ENCOUNTER — Ambulatory Visit: Payer: 59 | Admitting: Diagnostic Neuroimaging

## 2022-12-18 ENCOUNTER — Encounter: Payer: Self-pay | Admitting: Neurology

## 2022-12-18 VITALS — BP 115/86 | HR 81 | Ht 68.0 in | Wt 108.0 lb

## 2022-12-18 DIAGNOSIS — H4902 Third [oculomotor] nerve palsy, left eye: Secondary | ICD-10-CM

## 2022-12-18 DIAGNOSIS — I708 Atherosclerosis of other arteries: Secondary | ICD-10-CM | POA: Diagnosis not present

## 2022-12-18 NOTE — Patient Instructions (Addendum)
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

## 2022-12-18 NOTE — Progress Notes (Signed)
[facility]  PATIENT: Madeline Cervantes DOB: March 10, 1971  REFERRING CLINICIAN: Orpah Greek,* HISTORY FROM: patient  REASON FOR VISIT: new consult    HISTORICAL  CHIEF COMPLAINT:  Chief Complaint  Patient presents with   New Patient (Initial Visit)    Patient in room #6 with son. Patient states she having some issues with her left eye and with her balance.    HISTORY OF PRESENT ILLNESS:   52 year old female here for evaluation of left 3rd nerve palsy.  12/10/2022 patient went to the emergency room for evaluation of left eye abnormality.  Left eye was having droopy eyelid, and patient was complaining of double vision.  Symptoms may have started a few days before presented to the emergency room.  She had MRI of the brain which was unremarkable.  CT of the head and neck were unremarkable except for left subclavian occlusion with reconstitution via vertebral artery.  Since then patient's symptoms are stable.  No slurred speech, headaches or weakness.  She smokes cigarettes but is interested in cutting down.  She has not been able to return to work yet because of the blurred vision.  She is planning to see ophthalmology.    REVIEW OF SYSTEMS: Full 14 system review of systems performed and negative with exception of: as per HPI.  ALLERGIES: No Known Allergies  HOME MEDICATIONS: Outpatient Medications Prior to Visit  Medication Sig Dispense Refill   ALPRAZolam (XANAX) 1 MG tablet Take 1 mg by mouth 4 (four) times daily as needed for anxiety.      aspirin EC 81 MG tablet Take 81 mg by mouth daily. Swallow whole.     ibuprofen (ADVIL) 200 MG tablet Take 200 mg by mouth every 6 (six) hours as needed.     naphazoline-glycerin (CLEAR EYES REDNESS) 0.012-0.25 % SOLN 1-2 drops 4 (four) times daily as needed for eye irritation.     VIIBRYD 40 MG TABS Take 40 mg by mouth daily.     albuterol (VENTOLIN HFA) 108 (90 Base) MCG/ACT inhaler Inhale 2 puffs into the  lungs every 6 (six) hours as needed for wheezing or shortness of breath. (Patient not taking: Reported on 02/26/2020) 18 g 0   fluticasone (FLONASE) 50 MCG/ACT nasal spray SPRAY 2 SPRAYS INTO EACH NOSTRIL EVERY DAY (Patient not taking: Reported on 12/12/2022) 48 mL 1   No facility-administered medications prior to visit.    PAST MEDICAL HISTORY: Past Medical History:  Diagnosis Date   Allergic rhinitis    Anxiety    Glaucoma    Smoker    Vasomotor rhinitis     PAST SURGICAL HISTORY: History reviewed. No pertinent surgical history.  FAMILY HISTORY: History reviewed. No pertinent family history.  SOCIAL HISTORY: Social History   Socioeconomic History   Marital status: Single    Spouse name: Not on file   Number of children: Not on file   Years of education: Not on file   Highest education level: Not on file  Occupational History   Not on file  Tobacco Use   Smoking status: Every Day    Packs/day: 1    Types: Cigarettes   Smokeless tobacco: Never  Substance and Sexual Activity   Alcohol use: No   Drug use: No   Sexual activity: Not on file  Other Topics Concern   Not on file  Social History Narrative   Not on file   Social Determinants of Health   Financial Resource Strain: Not on file  Food Insecurity: Not on file  Transportation Needs: Not on file  Physical Activity: Not on file  Stress: Not on file  Social Connections: Not on file  Intimate Partner Violence: Not on file     PHYSICAL EXAM  GENERAL EXAM/CONSTITUTIONAL: Vitals:  Vitals:   12/18/22 1605  BP: 115/86  Pulse: 81  Weight: 108 lb (49 kg)  Height: 5\' 8"  (1.727 m)   Body mass index is 16.42 kg/m. Wt Readings from Last 3 Encounters:  12/18/22 108 lb (49 kg)  12/10/22 107 lb 11.2 oz (48.9 kg)  06/22/22 114 lb 6.4 oz (51.9 kg)   Patient is in no distress; well developed, nourished and groomed; neck is supple  CARDIOVASCULAR: Examination of carotid arteries is normal; no carotid  bruits Regular rate and rhythm, no murmurs Examination of peripheral vascular system by observation and palpation is normal; EXCEPT LEFT RADIAL PULSE LOWER THAN RIGHT  EYES: Ophthalmoscopic exam of optic discs and posterior segments is normal; no papilledema or hemorrhages No results found.  MUSCULOSKELETAL: Gait, strength, tone, movements noted in Neurologic exam below  NEUROLOGIC: MENTAL STATUS:      No data to display         awake, alert, oriented to person, place and time recent and remote memory intact normal attention and concentration language fluent, comprehension intact, naming intact fund of knowledge appropriate  CRANIAL NERVE:  2nd - no papilledema on fundoscopic exam 2nd, 3rd, 4th, 6th - pupils e--> RIGHT 4, LEFT 4; REACTIVE; visual fields full to confrontation, RIGHT EYE EOM FULL; LEFT EYE DECR ADDUCTION, ELEVATION, DEPRESSION WITH BINOCULAR BLURRED / OVERLAPPING VISION 5th - facial sensation symmetric 7th - facial strength symmetric 8th - hearing intact 9th - palate elevates symmetrically, uvula midline 11th - shoulder shrug symmetric 12th - tongue protrusion midline  MOTOR:  normal bulk and tone, full strength in the BUE, BLE  SENSORY:  normal and symmetric to light touch, temperature, vibration  COORDINATION:  finger-nose-finger, fine finger movements normal  REFLEXES:  deep tendon reflexes TRACE and symmetric  GAIT/STATION:  narrow based gait    DIAGNOSTIC DATA (LABS, IMAGING, TESTING) - I reviewed patient records, labs, notes, testing and imaging myself where available.  Lab Results  Component Value Date   WBC 7.4 12/10/2022   HGB 14.0 12/10/2022   HCT 40.6 12/10/2022   MCV 92.5 12/10/2022   PLT 249 12/10/2022      Component Value Date/Time   NA 134 (L) 12/10/2022 2006   NA 140 07/07/2020 1439   K 3.8 12/10/2022 2006   CL 101 12/10/2022 2006   CO2 25 12/10/2022 2006   GLUCOSE 155 (H) 12/10/2022 2006   BUN 19 12/10/2022 2006    BUN 14 07/07/2020 1439   CREATININE 1.03 (H) 12/10/2022 2006   CALCIUM 8.6 (L) 12/10/2022 2006   PROT 7.5 12/10/2022 2006   PROT 7.1 07/07/2020 1439   ALBUMIN 4.2 12/10/2022 2006   ALBUMIN 4.6 07/07/2020 1439   AST 19 12/10/2022 2006   ALT 10 12/10/2022 2006   ALKPHOS 72 12/10/2022 2006   BILITOT 0.7 12/10/2022 2006   BILITOT 0.5 07/07/2020 1439   GFRNONAA >60 12/10/2022 2006   GFRAA 123 07/07/2020 1439   No results found for: "CHOL", "HDL", "LDLCALC", "LDLDIRECT", "TRIG", "CHOLHDL" No results found for: "HGBA1C" No results found for: "VITAMINB12" No results found for: "TSH"  12/10/22 CTA head / neck 1. No emergent large vessel occlusion or high-grade stenosis of the intracranial arteries. 2. Short segment occlusion of the  proximal left subclavian artery with reconstitution proximal to the origin of the left vertebral artery. This may predispose to subclavian steal syndrome, which could be assessed with ultrasound.  12/11/22 MRI brain  1. No acute intracranial abnormality. 2. Multifocal hyperintense T2-weighted signal within the white matter, nonspecific but most commonly seen in the setting of chronic small vessel ischemia.   ASSESSMENT AND PLAN  52 y.o. year old female here with:   Dx:  1. Left subclavian artery occlusion   2. 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; possible microvascular infarction to the nerve; small vessel thrombosis) - check A1c, lipids; check AchR ab, TSH, CK, aldolase - check TTE - start aspirin 81mg  daily - follow up with ophthalmology - stop smoking - return to work depends on improvement in vision; out of work for next 4 weeks and re-evaluate  LEFT SUBCLAVIAN OCCLUSION - refer to vascular surgery  Orders Placed This Encounter  Procedures   Hemoglobin A1c   Lipid panel   CK   Aldolase   AChR Abs with Reflex to MuSK   Sedimentation Rate   C-reactive Protein   TSH Rfx on  Abnormal to Free T4   Ambulatory referral to Vascular Surgery   ECHOCARDIOGRAM COMPLETE BUBBLE STUDY   Return for pending if symptoms worsen or fail to improve, pending test results.    Penni Bombard, MD [id number], [id number] PM Certified in Neurology, Neurophysiology and Neuroimaging  [facility] [address], [place], [place] [address] [phone]

## 2022-12-19 LAB — TSH RFX ON ABNORMAL TO FREE T4: TSH: 4.74 u[IU]/mL — ABNORMAL HIGH (ref 0.450–4.500)

## 2022-12-19 LAB — T4F: T4,Free (Direct): 1.19 ng/dL (ref 0.82–1.77)

## 2022-12-19 LAB — HEMOGLOBIN A1C
Est. average glucose Bld gHb Est-mCnc: 123 mg/dL
Hgb A1c MFr Bld: 5.9 % — ABNORMAL HIGH (ref 4.8–5.6)

## 2022-12-21 ENCOUNTER — Ambulatory Visit: Payer: 59 | Admitting: Vascular Surgery

## 2022-12-21 ENCOUNTER — Encounter: Payer: Self-pay | Admitting: Vascular Surgery

## 2022-12-21 VITALS — BP 143/93 | HR 85 | Temp 98.2°F | Resp 20 | Ht 68.0 in | Wt 105.7 lb

## 2022-12-21 DIAGNOSIS — I708 Atherosclerosis of other arteries: Secondary | ICD-10-CM

## 2022-12-21 NOTE — Progress Notes (Signed)
ASSESSMENT & PLAN   LEFT SUBCLAVIAN ARTERY OCCLUSION: This patient had an incidental finding of a left subclavian artery occlusion on the CT angio of the head and neck.  I do not get any significant vertebrobasilar symptoms on history.  I have instructed her to always have her blood pressure taken in the right arm as this will be more accurate.  She has a pulse in her left wrist and I suspect she has reverse flow in her left vertebral although she has not had a carotid duplex scan.  Regardless, as she is asymptomatic I would not recommend consideration for a carotid subclavian bypass and when she developed persistent vertebrobasilar symptoms.  Given that this is occluded over a fairly long distance, this may not be amenable to angioplasty and stenting.  I would like to see her back in 6 months and I ordered a carotid duplex scan at that time.  She knows to call sooner if she has problems.  She is on aspirin.  I think she would benefit from being on a statin and she will discuss this with her primary care physician.  From my standpoint she can return to work.  REASON FOR CONSULT:    Left subclavian artery occlusion.  The consult is requested by Dr. Leta Baptist.  HPI:   Madeline Cervantes is a 52 y.o. female who is referred with a left subclavian artery occlusion.  I have reviewed the records from the referring office.  The patient was seen by neurology on 12/18/2022 for evaluation of a left 3rd nerve palsy.  She been having a droopy eyelid and been having double vision.  Part of her workup included a CT of the head and neck and an incidental finding was a left subclavian artery occlusion.  She is scheduled to see ophthalmology.  On my history, the patient tells me that just recently her drooping of the eyelid has improved significantly and her vision has resolved significantly.  I do not get any history of upper extremity symptoms or significant problems with dizziness or vertebrobasilar symptoms.   She does lift at work.  She is right-handed.  She is on aspirin.  She currently is not on a statin.  I do not get any history of claudication, rest pain, or nonhealing ulcers.  Past Medical History:  Diagnosis Date   Allergic rhinitis    Anxiety    Glaucoma    Smoker    Vasomotor rhinitis     History reviewed. No pertinent family history.  SOCIAL HISTORY: Social History   Tobacco Use   Smoking status: Every Day    Packs/day: 1    Types: Cigarettes   Smokeless tobacco: Never  Substance Use Topics   Alcohol use: No    No Known Allergies  Current Outpatient Medications  Medication Sig Dispense Refill   ALPRAZolam (XANAX) 1 MG tablet Take 1 mg by mouth 4 (four) times daily as needed for anxiety.      aspirin EC 81 MG tablet Take 81 mg by mouth daily. Swallow whole.     ibuprofen (ADVIL) 200 MG tablet Take 200 mg by mouth every 6 (six) hours as needed.     naphazoline-glycerin (CLEAR EYES REDNESS) 0.012-0.25 % SOLN 1-2 drops 4 (four) times daily as needed for eye irritation.     VIIBRYD 40 MG TABS Take 40 mg by mouth daily.     albuterol (VENTOLIN HFA) 108 (90 Base) MCG/ACT inhaler Inhale 2 puffs into the lungs every 6 (six)  hours as needed for wheezing or shortness of breath. (Patient not taking: Reported on 02/26/2020) 18 g 0   fluticasone (FLONASE) 50 MCG/ACT nasal spray SPRAY 2 SPRAYS INTO EACH NOSTRIL EVERY DAY (Patient not taking: Reported on 12/12/2022) 48 mL 1   No current facility-administered medications for this visit.    REVIEW OF SYSTEMS:  [X]  denotes positive finding, [ ]  denotes negative finding Cardiac  Comments:  Chest pain or chest pressure:    Shortness of breath upon exertion: x   Short of breath when lying flat:    Irregular heart rhythm:        Vascular    Pain in calf, thigh, or hip brought on by ambulation:    Pain in feet at night that wakes you up from your sleep:     Blood clot in your veins:    Leg swelling:         Pulmonary    Oxygen  at home:    Productive cough:     Wheezing:         Neurologic    Sudden weakness in arms or legs:     Sudden numbness in arms or legs:     Sudden onset of difficulty speaking or slurred speech:    Temporary loss of vision in one eye:     Problems with dizziness:         Gastrointestinal    Blood in stool:     Vomited blood:         Genitourinary    Burning when urinating:     Blood in urine:        Psychiatric    Major depression:         Hematologic    Bleeding problems:    Problems with blood clotting too easily:        Skin    Rashes or ulcers:        Constitutional    Fever or chills:    -  PHYSICAL EXAM:   Vitals:   12/21/22 0945 12/21/22 0948  BP: 116/83 (!) 143/93  Pulse: 85   Resp: 20   Temp: 98.2 F (36.8 C)   SpO2: 95%   Weight: 105 lb 11.2 oz (47.9 kg)   Height: 5\' 8"  (1.727 m)    Body mass index is 16.07 kg/m. GENERAL: The patient is a well-nourished female, in no acute distress. The vital signs are documented above. CARDIAC: There is a regular rate and rhythm.  VASCULAR: I do not detect carotid or supraclavicular bruits. She has palpable radial pulses bilaterally. She has palpable posterior tibial pulses bilaterally. Blood pressure in the right arm is 143/93.  The blood pressure in the left arm is 116/83. PULMONARY: There is good air exchange bilaterally without wheezing or rales. ABDOMEN: Soft and non-tender with normal pitched bowel sounds.  MUSCULOSKELETAL: There are no major deformities. NEUROLOGIC: No focal weakness or paresthesias are detected. SKIN: There are no ulcers or rashes noted. PSYCHIATRIC: The patient has a normal affect.  DATA:    CTA HEAD NECK: I have reviewed the CT of the head and neck.  This shows a short segment occlusion of the left subclavian artery with reconstitution proximal to the origin of the left vertebral artery.  Deitra Mayo [facility]

## 2022-12-22 ENCOUNTER — Other Ambulatory Visit: Payer: Self-pay

## 2022-12-22 DIAGNOSIS — I708 Atherosclerosis of other arteries: Secondary | ICD-10-CM

## 2022-12-25 ENCOUNTER — Encounter: Payer: Self-pay | Admitting: Family Medicine

## 2022-12-25 ENCOUNTER — Ambulatory Visit: Payer: 59 | Admitting: Family Medicine

## 2022-12-25 VITALS — BP 110/80 | HR 99 | Temp 98.3°F | Resp 18 | Wt 105.4 lb

## 2022-12-25 DIAGNOSIS — I708 Atherosclerosis of other arteries: Secondary | ICD-10-CM | POA: Diagnosis not present

## 2022-12-25 DIAGNOSIS — H4902 Third [oculomotor] nerve palsy, left eye: Secondary | ICD-10-CM | POA: Diagnosis not present

## 2022-12-25 NOTE — Progress Notes (Signed)
   Subjective:    Patient ID: Madeline Cervantes, female    DOB: 1971/01/15, 52 y.o.   MRN: [id number]  HPI She is here for a recheck.  She has been seen by neurology as well as cardiovascular.  She had an incidental finding of subclavian artery occlusion.  Vascular surgery is opted for watchful waiting on this.  She is scheduled to be seen by ophthalmology however finances have interfered with that.  She did state that she woke up Sunday and her vision had returned to normal.   Review of Systems     Objective:   Physical Exam Alert and in no distress.  Inspection of the eye indicates that there is symmetry with eye motion.       Assessment & Plan:  Left subclavian artery occlusion  3rd cranial nerve palsy, left I will write her a note to return to work tomorrow.

## 2022-12-28 LAB — LIPID PANEL
Chol/HDL Ratio: 3.9 ratio (ref 0.0–4.4)
Cholesterol, Total: 275 mg/dL — ABNORMAL HIGH (ref 100–199)
HDL: 70 mg/dL (ref >39)
LDL Chol Calc (NIH): 171 mg/dL — ABNORMAL HIGH (ref 0–99)
Triglycerides: 185 mg/dL — ABNORMAL HIGH (ref 0–149)
VLDL Cholesterol Cal: 34 mg/dL (ref 5–40)

## 2022-12-28 LAB — C-REACTIVE PROTEIN: CRP: 4 mg/L (ref 0–10)

## 2022-12-28 LAB — SEDIMENTATION RATE: Sed Rate: 10 mm/hr (ref 0–40)

## 2022-12-28 LAB — MUSK ANTIBODIES: MuSK Antibodies: <1 U/mL

## 2022-12-28 LAB — ACHR ABS WITH REFLEX TO MUSK: AChR Binding Ab, Serum: <0.03 nmol/L (ref 0.00–0.24)

## 2022-12-28 LAB — ALDOLASE: Aldolase: 6.8 U/L (ref 3.3–10.3)

## 2022-12-28 LAB — CK: Total CK: 61 U/L (ref 32–182)

## 2023-01-04 ENCOUNTER — Encounter (HOSPITAL_COMMUNITY): Payer: Self-pay | Admitting: Diagnostic Neuroimaging

## 2023-01-18 ENCOUNTER — Telehealth (HOSPITAL_COMMUNITY): Payer: Self-pay | Admitting: Diagnostic Neuroimaging

## 2023-01-18 NOTE — Telephone Encounter (Signed)
Just an FYI. We have made several attempts to contact this patient including sending a letter to schedule or reschedule their echocardiogram bubble. We will be removing the patient from the echo WQ.    MAILED LETTER [facility] 01/04/23 [facility] to schedule x 3 @ 11:12/[facility] 01/01/23 [facility] to schedule x 2 @ 1:41/[facility] 12/26/22 [facility] to schedule @ 9:39/LBW        Thank you

## 2023-06-21 ENCOUNTER — Ambulatory Visit: Payer: 59 | Admitting: Vascular Surgery

## 2023-06-21 ENCOUNTER — Ambulatory Visit (HOSPITAL_COMMUNITY): Payer: 59 | Attending: Vascular Surgery

## 2023-07-06 ENCOUNTER — Other Ambulatory Visit: Payer: Self-pay | Admitting: Family Medicine

## 2023-07-06 DIAGNOSIS — Z1212 Encounter for screening for malignant neoplasm of rectum: Secondary | ICD-10-CM

## 2023-07-06 DIAGNOSIS — Z1211 Encounter for screening for malignant neoplasm of colon: Secondary | ICD-10-CM

## 2023-10-05 ENCOUNTER — Encounter: Payer: Self-pay | Admitting: Medical

## 2023-10-05 ENCOUNTER — Telehealth (INDEPENDENT_AMBULATORY_CARE_PROVIDER_SITE_OTHER): Payer: 59 | Admitting: Medical

## 2023-10-05 VITALS — Ht 68.0 in | Wt 105.0 lb

## 2023-10-05 DIAGNOSIS — J988 Other specified respiratory disorders: Secondary | ICD-10-CM | POA: Diagnosis not present

## 2023-10-05 DIAGNOSIS — F17210 Nicotine dependence, cigarettes, uncomplicated: Secondary | ICD-10-CM | POA: Diagnosis not present

## 2023-10-05 DIAGNOSIS — F17200 Nicotine dependence, unspecified, uncomplicated: Secondary | ICD-10-CM

## 2023-10-05 DIAGNOSIS — R058 Other specified cough: Secondary | ICD-10-CM

## 2023-10-05 MED ORDER — ALBUTEROL SULFATE HFA 108 (90 BASE) MCG/ACT IN AERS: 2.0000 | INHALATION_SPRAY | Freq: Four times a day (QID) | RESPIRATORY_TRACT | 0 refills | Status: DC | PRN

## 2023-10-05 MED ORDER — HYDROCODONE BIT-HOMATROP MBR 5-1.5 MG/5ML PO SOLN: 5.0000 mL | Freq: Three times a day (TID) | ORAL | 0 refills | Status: AC | PRN

## 2023-10-05 MED ORDER — AMOXICILLIN 875 MG PO TABS: 875.0000 mg | ORAL_TABLET | Freq: Two times a day (BID) | ORAL | 0 refills | Status: AC

## 2023-10-05 NOTE — Progress Notes (Signed)
 Subjective:     Patient ID: Suzen JAYSON Child, female   DOB: Mar 12, 1971, 53 y.o.   MRN: [id number]  This visit type was conducted due to national recommendations for restrictions regarding the COVID-19 Pandemic (e.g. social distancing) in an effort to limit this patient's exposure and mitigate transmission in our community.  Due to their co-morbid illnesses, this patient is at least at moderate risk for complications without adequate follow up.  This format is felt to be most appropriate for this patient at this time.    Documentation for virtual audio and video telecommunications through [facility] encounter:  The patient was located at home. The provider was located in the office. The patient did consent to this visit and is aware of possible charges through their insurance for this visit.  The other persons participating in this telemedicine service were none. Time spent on call was 20 minutes and in review of previous records 20 minutes total.  This virtual service is not related to other E/M service within previous 7 days.   HPI Chief Complaint  Patient presents with   URI    Sinus congestion, pressure, chest congestion, dry cough. 1 week.    Virtual consult for illness.  She notes 1+ week history of respiratory tract infection.  Not improving and getting worse.  Has a lot of thick mucus, choking on mucus, has had some hot and cold feelings, but no fever.  Coughing a lot.  No nausea vomiting or diarrhea.  Positive sinus pressure and headache.  No abdominal pain.  She has felt some shortness of breath.  She is a smoker.  No history of lung disease.  Mucus is yellowish and some blood-tinged. Using Zyrtec  without much improvement.  Will something stronger for cough as well.  No other aggravating or relieving factors. No other complaint.  Past Medical History:  Diagnosis Date   Allergic rhinitis    Anxiety    Glaucoma    Smoker    Vasomotor rhinitis    Current Outpatient Medications  on File Prior to Visit  Medication Sig Dispense Refill   ALPRAZolam (XANAX) 1 MG tablet Take 1 mg by mouth 4 (four) times daily as needed for anxiety.      aspirin EC 81 MG tablet Take 81 mg by mouth daily. Swallow whole.     fluticasone  (FLONASE ) 50 MCG/ACT nasal spray SPRAY 2 SPRAYS INTO EACH NOSTRIL EVERY DAY 48 mL 1   VIIBRYD 40 MG TABS Take 40 mg by mouth daily.     ibuprofen (ADVIL) 200 MG tablet Take 200 mg by mouth every 6 (six) hours as needed. (Patient not taking: Reported on 10/05/2023)     naphazoline-glycerin (CLEAR EYES REDNESS) 0.012-0.25 % SOLN 1-2 drops 4 (four) times daily as needed for eye irritation. (Patient not taking: Reported on 10/05/2023)     No current facility-administered medications on file prior to visit.     Review of Systems As in subjective    Objective:   Physical Exam Due to coronavirus pandemic stay at home measures, patient visit was virtual and they were not examined in person.   Ht 5' 8 (1.727 m) Comment: patient reported  Wt 105 lb (47.6 kg) Comment: patient reported  BMI 15.97 kg/m   Gen: wd, wn, nad Somewhat ill-appearing Coarse but no labored breathing or wheezing      Assessment:     Encounter Diagnoses  Name Primary?   Cough productive of purulent sputum Yes   Respiratory tract infection  Smoker        Plan:     Advise rest, hydration, Tylenol  as needed, and given her week plus symptoms getting worse with colored mucus, begin medications below.  Discussed risk and benefits of medicine including Hycodan cough syrup.  Use albuterol  least once or twice a day through the next 3 to 4 days.  If not much improved in the next 72 hours then call or recheck.  [place]ora was seen today for uri.  Diagnoses and all orders for this visit:  Cough productive of purulent sputum  Respiratory tract infection  Smoker  Other orders -     amoxicillin  (AMOXIL ) 875 MG tablet; Take 1 tablet (875 mg total) by mouth 2 (two) times daily for 10  days. -     albuterol  (VENTOLIN  HFA) 108 (90 Base) MCG/ACT inhaler; Inhale 2 puffs into the lungs every 6 (six) hours as needed for wheezing or shortness of breath. -     HYDROcodone  bit-homatropine (HYCODAN) 5-1.5 MG/5ML syrup; Take 5 mLs by mouth every 8 (eight) hours as needed for up to 5 days for cough.    F/u prn

## 2023-10-08 ENCOUNTER — Encounter: Payer: Self-pay | Admitting: Medical

## 2023-10-08 ENCOUNTER — Encounter: Payer: Self-pay | Admitting: Family Medicine

## 2023-10-08 ENCOUNTER — Telehealth: Payer: Self-pay | Admitting: Family Medicine

## 2023-10-08 NOTE — Telephone Encounter (Signed)
 Pt did a virtual with you on Friday and is asking if you can write her out for today? She says she is doing a lot of coughing and her ribs hurt. She works over night.

## 2023-10-27 ENCOUNTER — Other Ambulatory Visit: Payer: Self-pay | Admitting: Medical

## 2023-11-27 ENCOUNTER — Encounter: Payer: Self-pay | Admitting: Internal Medicine

## 2024-04-14 ENCOUNTER — Ambulatory Visit: Payer: Self-pay

## 2024-04-14 NOTE — Telephone Encounter (Signed)
 FYI Only or Action Required?: FYI only for provider.  Patient was last seen in primary care on 10/05/2023 by Bulah Alm RAMAN, PA-C.  Called Nurse Triage reporting Cough.  Symptoms began a week ago.  Interventions attempted:  ibuprofen, vicks, expired inhaler, previously rx hydroconone cough syrup, alkaseltzer  Symptoms are: gradually worsening.  Triage Disposition: See Physician Within 24 Hours  Patient/caregiver understands and will follow disposition?: Yes  Copied from CRM [id number]. Topic: Clinical - Red Word Triage >> Apr 14, 2024  9:50 AM Madeline Cervantes wrote: Red Word that prompted transfer to Nurse Triage: severe headache, severe cough, bad chills but temperature is 96.6, legs and feet are cramping Reason for Disposition  [1] Using nasal washes and pain medicine > 24 hours AND [2] sinus pain (around cheekbone or eye) persists  Protocols used: Cough - Acute Productive-A-AH

## 2024-04-15 ENCOUNTER — Ambulatory Visit: Admitting: Medical

## 2024-04-15 ENCOUNTER — Encounter: Payer: Self-pay | Admitting: Medical

## 2024-04-15 VITALS — BP 122/74 | HR 72 | Temp 96.5°F | Ht 68.0 in | Wt 114.0 lb

## 2024-04-15 DIAGNOSIS — F17200 Nicotine dependence, unspecified, uncomplicated: Secondary | ICD-10-CM

## 2024-04-15 DIAGNOSIS — J988 Other specified respiratory disorders: Secondary | ICD-10-CM

## 2024-04-15 DIAGNOSIS — R918 Other nonspecific abnormal finding of lung field: Secondary | ICD-10-CM

## 2024-04-15 DIAGNOSIS — R062 Wheezing: Secondary | ICD-10-CM

## 2024-04-15 DIAGNOSIS — R059 Cough, unspecified: Secondary | ICD-10-CM

## 2024-04-15 MED ORDER — ALBUTEROL SULFATE HFA 108 (90 BASE) MCG/ACT IN AERS: 2.0000 | INHALATION_SPRAY | Freq: Four times a day (QID) | RESPIRATORY_TRACT | 1 refills | Status: AC | PRN

## 2024-04-15 MED ORDER — AZITHROMYCIN 250 MG PO TABS: ORAL_TABLET | ORAL | 0 refills | Status: AC

## 2024-04-15 NOTE — Progress Notes (Signed)
 Subjective:  Madeline Cervantes is a 53 y.o. female who presents for Chief Complaint  Patient presents with   Cough    Cold,sever cough and HA that started last week. 2 home covid tests done 4 days apart both neg.      Here with son for cough.  Has been dealing with cough for a week.   Missed work this weekend due to symptoms.  She notes bad headache, body aches, body cramping, lots and lots of cough, ribs hurt.  No fever, temp was actually low.   Has had some ear ringing and discomfort.  Had really bad sore throat, couldn't eat a first.  Has been using salt water gargles.   Has had slight wheeze at times, that has improved since last week.   No NVD.  Has been using cough syrup prescription left over from last visit.  Using also some OTC night time flu and cold medication.   She is a smoker.  been smoking since age 79yo, usually close to 1ppd.   Had inhaler, but just ran out.  Had been using that some, using maybe nightly.   1 coworker reportedly had RSV recently.  She notes doing 2 COVID test in the past week at home that were both negative  No other aggravating or relieving factors.    No other c/o.  Past Medical History:  Diagnosis Date   Allergic rhinitis    Anxiety    Glaucoma    Smoker    Vasomotor rhinitis    Current Outpatient Medications on File Prior to Visit  Medication Sig Dispense Refill   acetaminophen  (TYLENOL ) 500 MG tablet Take 500 mg by mouth every 6 (six) hours as needed.     ALPRAZolam (XANAX) 1 MG tablet Take 1 mg by mouth 4 (four) times daily as needed for anxiety.      ibuprofen (ADVIL) 200 MG tablet Take 200 mg by mouth every 6 (six) hours as needed.     VIIBRYD 40 MG TABS Take 40 mg by mouth daily.     aspirin EC 81 MG tablet Take 81 mg by mouth daily. Swallow whole. (Patient not taking: Reported on 04/15/2024)     fluticasone  (FLONASE ) 50 MCG/ACT nasal spray SPRAY 2 SPRAYS INTO EACH NOSTRIL EVERY DAY (Patient not taking: Reported on 04/15/2024) 48 mL 1    ibuprofen (ADVIL) 200 MG tablet Take 200 mg by mouth every 6 (six) hours as needed. (Patient not taking: Reported on 04/15/2024)     naphazoline-glycerin (CLEAR EYES REDNESS) 0.012-0.25 % SOLN 1-2 drops 4 (four) times daily as needed for eye irritation. (Patient not taking: Reported on 04/15/2024)     No current facility-administered medications on file prior to visit.    The following portions of the patient's history were reviewed and updated as appropriate: allergies, current medications, past family history, past medical history, past social history, past surgical history and problem list.  ROS Otherwise as in subjective above  Objective: BP 122/74   Pulse 72   Temp (!) 96.5 F (35.8 C) (Tympanic)   Ht 5' 8 (1.727 m)   Wt 114 lb (51.7 kg)   SpO2 97%   BMI 17.33 kg/m   Wt Readings from Last 3 Encounters:  04/15/24 114 lb (51.7 kg)  10/05/23 105 lb (47.6 kg)  12/25/22 105 lb 6.4 oz (47.8 kg)    General appearance: alert, no distress, well developed, well nourished HEENT: normocephalic, sclerae anicteric, conjunctiva pink and moist, TMs flat, nares patent,  no discharge or erythema, pharynx with mild erythema Oral cavity: MMM, no lesions Neck: supple, no lymphadenopathy, no thyromegaly, no masses Heart: RRR, normal S1, S2, no murmurs Lungs: dullness right lower field, otherwise clear, no wheezes, rhonchi, or rales Pulses: 2+ radial pulses, 2+ pedal pulses, normal cap refill Ext: no edema   Assessment: Encounter Diagnoses  Name Primary?   Respiratory tract infection Yes   Smoker    Lung field abnormal    Cough, unspecified type    Wheezing       Plan: Respiratory tract infection, cough, wheezing She has Hycodan cough syrup at home she can use for worse cough.  Caution with sedation Increase water intake, rest Can use salt water gargles as needed Begin Z-Pak antibiotic.  Use albuterol  as needed 2 puffs every 4-6 hours If not much improved in the next 72 hours then  recheck  Smoker greater than 20 years-advised smoking cessation.  We discussed lung cancer screening.  We also discussed the abnormal lung field.  Referral for CT  No prior maintenance inhaler.  No prior CT lung cancer screening.   Taejah was seen today for cough.  Diagnoses and all orders for this visit:  Respiratory tract infection  Smoker -     CT CHEST LUNG CA SCREEN LOW DOSE W/O CM; Future  Lung field abnormal  Cough, unspecified type  Wheezing  Other orders -     albuterol  (VENTOLIN  HFA) 108 (90 Base) MCG/ACT inhaler; Inhale 2 puffs into the lungs every 6 (six) hours as needed for wheezing or shortness of breath. -     azithromycin  (ZITHROMAX ) 250 MG tablet; 2 tablets day 1, then 1 tablet days 2-5    Follow up: pending CT

## 2024-04-18 ENCOUNTER — Ambulatory Visit
Admission: RE | Admit: 2024-04-18 | Discharge: 2024-04-18 | Disposition: A | Discharge status: Home / Self Care | Source: Ambulatory Visit | Attending: Medical | Admitting: Medical

## 2024-04-18 DIAGNOSIS — F17200 Nicotine dependence, unspecified, uncomplicated: Secondary | ICD-10-CM

## 2024-04-28 ENCOUNTER — Other Ambulatory Visit: Payer: Self-pay | Admitting: Medical

## 2024-04-28 ENCOUNTER — Ambulatory Visit: Payer: Self-pay | Admitting: Medical

## 2024-04-28 MED ORDER — ROSUVASTATIN CALCIUM 10 MG PO TABS: 10.0000 mg | ORAL_TABLET | Freq: Every day | ORAL | 0 refills | Status: DC

## 2024-04-28 NOTE — Progress Notes (Signed)
 Your CT scan shows an area in the right middle lobe of the lung that is questionable.  Radiologist recommends a repeat scan in 3 months.  There was CTs signs of emphysema.  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  I recommend starting cholesterol medicine such as Crestor  10 mg daily at bedtime to lower your cholesterol lower your risk of heart disease and stroke.  I recommend continuing daily 81 mg aspirin.  Let me know if agreeable to cholesterol medicine.  Lets see you back in 3 months to discuss before repeat scan

## 2024-05-06 NOTE — Telephone Encounter (Signed)
 FMLA paperwork in correspondence folder.

## 2024-05-06 NOTE — Telephone Encounter (Unsigned)
 Copied from CRM [id number]. Topic: Clinical - Medication Question >> May 06, 2024 12:02 PM Everette C wrote: Reason for CRM: The patient is interested in being prescribed something to help with smoking cessation. Please contact the patient further to discuss their options when available

## 2024-05-09 NOTE — Telephone Encounter (Signed)
 Left message for pt to call back, forms are ready  and needs to pay $29 form fee

## 2024-05-12 ENCOUNTER — Telehealth: Payer: Self-pay | Admitting: Medical

## 2024-05-12 DIAGNOSIS — Z0279 Encounter for issue of other medical certificate: Secondary | ICD-10-CM

## 2024-05-12 NOTE — Telephone Encounter (Signed)
 Copied from CRM [id number]. Topic: General - Other >> May 12, 2024  9:48 AM Graeme ORN wrote: Reason for CRM: Patient calling to provider fax number to send completed FMLA paperwork: [phone].  Faxed form and emailed copy to pt

## 2024-05-14 ENCOUNTER — Telehealth: Payer: Self-pay | Admitting: Medical

## 2024-05-14 NOTE — Telephone Encounter (Signed)
 I have not seen any forms today

## 2024-05-16 ENCOUNTER — Telehealth: Payer: Self-pay | Admitting: Medical

## 2024-05-16 ENCOUNTER — Encounter: Payer: Self-pay | Admitting: Medical

## 2024-05-16 DIAGNOSIS — J40 Bronchitis, not specified as acute or chronic: Secondary | ICD-10-CM | POA: Insufficient documentation

## 2024-05-16 DIAGNOSIS — J069 Acute upper respiratory infection, unspecified: Secondary | ICD-10-CM | POA: Insufficient documentation

## 2024-05-16 DIAGNOSIS — J439 Emphysema, unspecified: Secondary | ICD-10-CM | POA: Insufficient documentation

## 2024-05-16 NOTE — Telephone Encounter (Signed)
 See form, finish form, and make sure she is scheduled for a follow up in near future

## 2024-05-16 NOTE — Telephone Encounter (Signed)
 Forms faxed to [facility], patient notified and informed to make follow up appointment

## 2024-07-27 ENCOUNTER — Other Ambulatory Visit: Payer: Self-pay | Admitting: Medical

## 2024-09-26 ENCOUNTER — Telehealth: Payer: Self-pay | Admitting: Medical

## 2024-09-26 ENCOUNTER — Encounter (INDEPENDENT_AMBULATORY_CARE_PROVIDER_SITE_OTHER): Payer: Self-pay | Admitting: Medical

## 2024-09-26 DIAGNOSIS — Z0000 Encounter for general adult medical examination without abnormal findings: Secondary | ICD-10-CM

## 2024-09-26 NOTE — Telephone Encounter (Signed)
 Send no show letter and fee.   Check prior no show status too.

## 2024-09-26 NOTE — Progress Notes (Deleted)
 Full cpx  Vax

## 2024-09-30 ENCOUNTER — Encounter: Payer: Self-pay | Admitting: Medical
